# Patient Record
Sex: Female | Born: 1975 | Race: White | Hispanic: No | Marital: Married | Smoking: Former smoker
Health system: Southern US, Community
[De-identification: ages and names within clinical notes are randomized; demographics above are authoritative.]

## PROBLEM LIST (undated history)

## (undated) DIAGNOSIS — B999 Unspecified infectious disease: Secondary | ICD-10-CM

## (undated) DIAGNOSIS — O24419 Gestational diabetes mellitus in pregnancy, unspecified control: Secondary | ICD-10-CM

## (undated) DIAGNOSIS — Z91018 Allergy to other foods: Secondary | ICD-10-CM

## (undated) DIAGNOSIS — F329 Major depressive disorder, single episode, unspecified: Secondary | ICD-10-CM

## (undated) DIAGNOSIS — E669 Obesity, unspecified: Secondary | ICD-10-CM

## (undated) DIAGNOSIS — K8020 Calculus of gallbladder without cholecystitis without obstruction: Secondary | ICD-10-CM

## (undated) DIAGNOSIS — Z9889 Other specified postprocedural states: Secondary | ICD-10-CM

## (undated) DIAGNOSIS — I10 Essential (primary) hypertension: Secondary | ICD-10-CM

## (undated) DIAGNOSIS — R112 Nausea with vomiting, unspecified: Secondary | ICD-10-CM

## (undated) DIAGNOSIS — F419 Anxiety disorder, unspecified: Secondary | ICD-10-CM

## (undated) DIAGNOSIS — T4145XA Adverse effect of unspecified anesthetic, initial encounter: Secondary | ICD-10-CM

## (undated) DIAGNOSIS — IMO0002 Reserved for concepts with insufficient information to code with codable children: Secondary | ICD-10-CM

## (undated) HISTORY — DX: Gestational diabetes mellitus in pregnancy, unspecified control: O24.419

## (undated) HISTORY — DX: Essential (primary) hypertension: I10

## (undated) HISTORY — DX: Calculus of gallbladder without cholecystitis without obstruction: K80.20

## (undated) HISTORY — DX: Major depressive disorder, single episode, unspecified: F32.9

## (undated) HISTORY — DX: Anxiety disorder, unspecified: F41.9

## (undated) HISTORY — PX: WISDOM TOOTH EXTRACTION: SHX21

## (undated) HISTORY — DX: Reserved for concepts with insufficient information to code with codable children: IMO0002

## (undated) HISTORY — PX: NO PAST SURGERIES: SHX2092

## (undated) HISTORY — PX: SHOULDER SURGERY: SHX246

## (undated) HISTORY — DX: Unspecified infectious disease: B99.9

## (undated) HISTORY — DX: Adverse effect of unspecified anesthetic, initial encounter: T41.45XA

## (undated) HISTORY — DX: Obesity, unspecified: E66.9

---

## 1998-01-16 ENCOUNTER — Emergency Department (HOSPITAL_COMMUNITY): Admission: EM | Admit: 1998-01-16 | Discharge: 1998-01-17 | Payer: Self-pay | Admitting: Emergency Medicine

## 1998-02-23 ENCOUNTER — Ambulatory Visit (HOSPITAL_COMMUNITY): Admission: RE | Admit: 1998-02-23 | Discharge: 1998-02-23 | Payer: Self-pay | Admitting: *Deleted

## 1999-08-12 ENCOUNTER — Encounter: Payer: Self-pay | Admitting: Emergency Medicine

## 1999-08-12 ENCOUNTER — Emergency Department (HOSPITAL_COMMUNITY): Admission: EM | Admit: 1999-08-12 | Discharge: 1999-08-12 | Payer: Self-pay | Admitting: Emergency Medicine

## 2000-05-26 ENCOUNTER — Emergency Department (HOSPITAL_COMMUNITY): Admission: EM | Admit: 2000-05-26 | Discharge: 2000-05-26 | Payer: Self-pay | Admitting: Emergency Medicine

## 2001-05-13 DIAGNOSIS — R87619 Unspecified abnormal cytological findings in specimens from cervix uteri: Secondary | ICD-10-CM

## 2001-05-13 DIAGNOSIS — IMO0002 Reserved for concepts with insufficient information to code with codable children: Secondary | ICD-10-CM

## 2001-05-13 HISTORY — DX: Reserved for concepts with insufficient information to code with codable children: IMO0002

## 2001-05-13 HISTORY — DX: Unspecified abnormal cytological findings in specimens from cervix uteri: R87.619

## 2002-09-16 ENCOUNTER — Other Ambulatory Visit: Admission: RE | Admit: 2002-09-16 | Discharge: 2002-09-16 | Payer: Self-pay | Admitting: Obstetrics and Gynecology

## 2003-05-14 DIAGNOSIS — F32A Depression, unspecified: Secondary | ICD-10-CM

## 2003-05-14 DIAGNOSIS — K8020 Calculus of gallbladder without cholecystitis without obstruction: Secondary | ICD-10-CM

## 2003-05-14 HISTORY — DX: Depression, unspecified: F32.A

## 2003-05-14 HISTORY — DX: Calculus of gallbladder without cholecystitis without obstruction: K80.20

## 2003-07-26 ENCOUNTER — Other Ambulatory Visit: Admission: RE | Admit: 2003-07-26 | Discharge: 2003-07-26 | Payer: Self-pay | Admitting: Obstetrics and Gynecology

## 2003-09-28 ENCOUNTER — Ambulatory Visit (HOSPITAL_COMMUNITY): Admission: RE | Admit: 2003-09-28 | Discharge: 2003-09-28 | Payer: Self-pay | Admitting: Obstetrics and Gynecology

## 2003-11-04 ENCOUNTER — Ambulatory Visit (HOSPITAL_COMMUNITY): Admission: RE | Admit: 2003-11-04 | Discharge: 2003-11-04 | Payer: Self-pay | Admitting: Obstetrics and Gynecology

## 2003-12-27 ENCOUNTER — Encounter: Admission: RE | Admit: 2003-12-27 | Discharge: 2003-12-27 | Payer: Self-pay | Admitting: Gynecology

## 2004-01-05 ENCOUNTER — Ambulatory Visit (HOSPITAL_COMMUNITY): Admission: RE | Admit: 2004-01-05 | Discharge: 2004-01-05 | Payer: Self-pay | Admitting: Obstetrics and Gynecology

## 2004-01-20 ENCOUNTER — Inpatient Hospital Stay (HOSPITAL_COMMUNITY): Admission: AD | Admit: 2004-01-20 | Discharge: 2004-01-20 | Payer: Self-pay | Admitting: Obstetrics and Gynecology

## 2004-01-26 ENCOUNTER — Inpatient Hospital Stay (HOSPITAL_COMMUNITY): Admission: AD | Admit: 2004-01-26 | Discharge: 2004-01-28 | Payer: Self-pay | Admitting: Obstetrics and Gynecology

## 2004-02-02 ENCOUNTER — Ambulatory Visit (HOSPITAL_COMMUNITY): Admission: RE | Admit: 2004-02-02 | Discharge: 2004-02-02 | Payer: Self-pay | Admitting: Obstetrics & Gynecology

## 2004-02-07 ENCOUNTER — Ambulatory Visit: Payer: Self-pay | Admitting: Obstetrics & Gynecology

## 2004-02-10 ENCOUNTER — Ambulatory Visit: Payer: Self-pay | Admitting: Family Medicine

## 2004-02-11 DIAGNOSIS — T8859XA Other complications of anesthesia, initial encounter: Secondary | ICD-10-CM

## 2004-02-11 HISTORY — DX: Other complications of anesthesia, initial encounter: T88.59XA

## 2004-02-14 ENCOUNTER — Ambulatory Visit: Payer: Self-pay | Admitting: *Deleted

## 2004-02-16 ENCOUNTER — Ambulatory Visit (HOSPITAL_COMMUNITY): Admission: RE | Admit: 2004-02-16 | Discharge: 2004-02-16 | Payer: Self-pay | Admitting: Obstetrics and Gynecology

## 2004-02-16 ENCOUNTER — Ambulatory Visit: Payer: Self-pay | Admitting: Obstetrics & Gynecology

## 2004-02-20 ENCOUNTER — Ambulatory Visit: Payer: Self-pay | Admitting: Obstetrics and Gynecology

## 2004-02-23 ENCOUNTER — Inpatient Hospital Stay (HOSPITAL_COMMUNITY): Admission: RE | Admit: 2004-02-23 | Discharge: 2004-02-26 | Payer: Self-pay | Admitting: Obstetrics and Gynecology

## 2004-03-01 ENCOUNTER — Inpatient Hospital Stay (HOSPITAL_COMMUNITY): Admission: AD | Admit: 2004-03-01 | Discharge: 2004-03-01 | Payer: Self-pay | Admitting: Obstetrics and Gynecology

## 2004-05-13 HISTORY — PX: CHOLECYSTECTOMY: SHX55

## 2004-05-31 ENCOUNTER — Ambulatory Visit (HOSPITAL_COMMUNITY): Admission: RE | Admit: 2004-05-31 | Discharge: 2004-06-01 | Payer: Self-pay | Admitting: General Surgery

## 2004-05-31 ENCOUNTER — Encounter (INDEPENDENT_AMBULATORY_CARE_PROVIDER_SITE_OTHER): Payer: Self-pay | Admitting: Specialist

## 2005-06-07 IMAGING — US US OB FOLLOW-UP
1 series · 13 of 22 positions shown · non-contrast
Comparison: none

CLINICAL DATA: Evaluate growth.

[Series 1: unknown · 0.33mm/px · 13 of 22 slices shown]
[im 1/22]
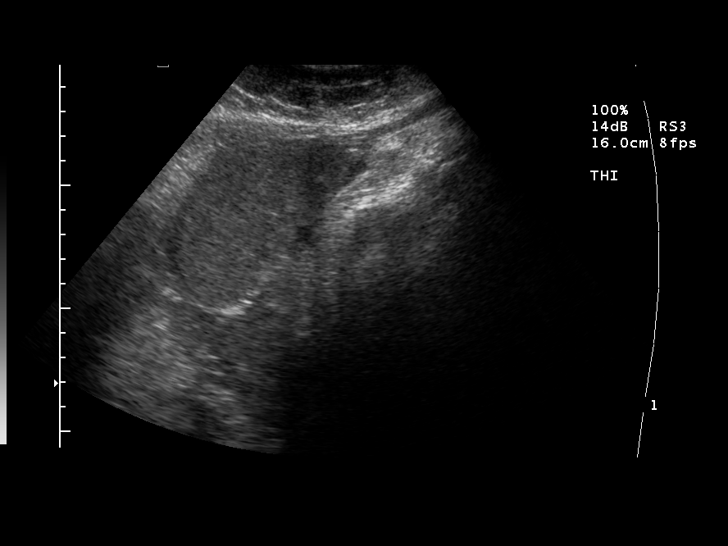
[im 3/22]
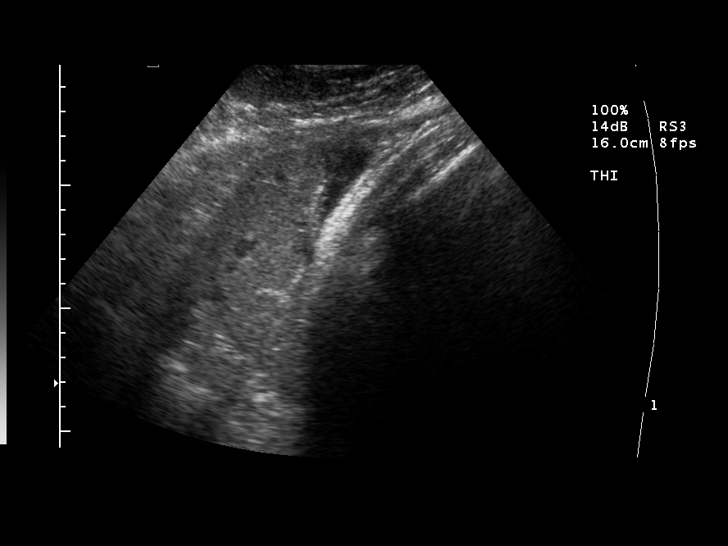
[im 5/22]
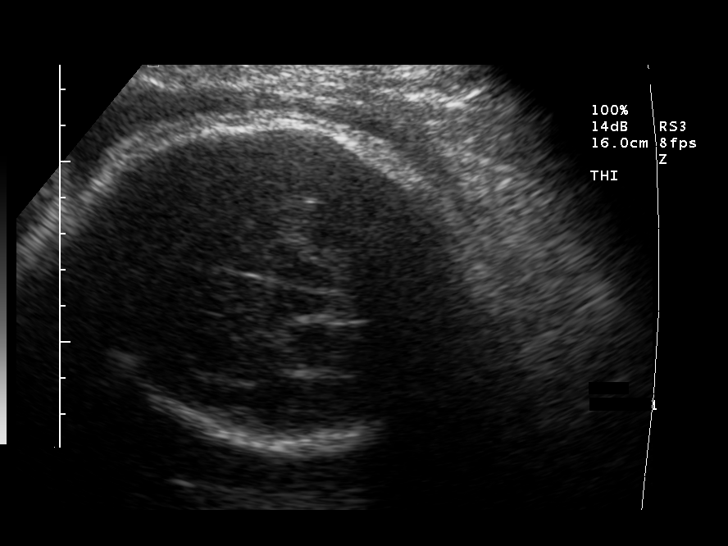
[im 6/22]
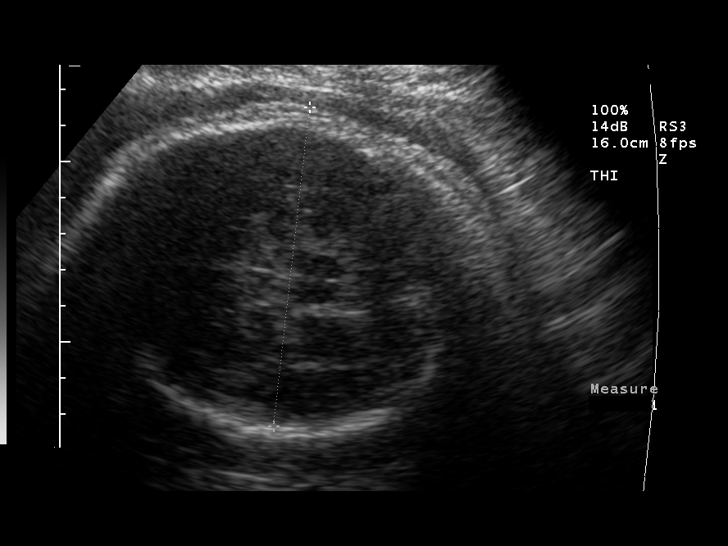
[im 8/22]
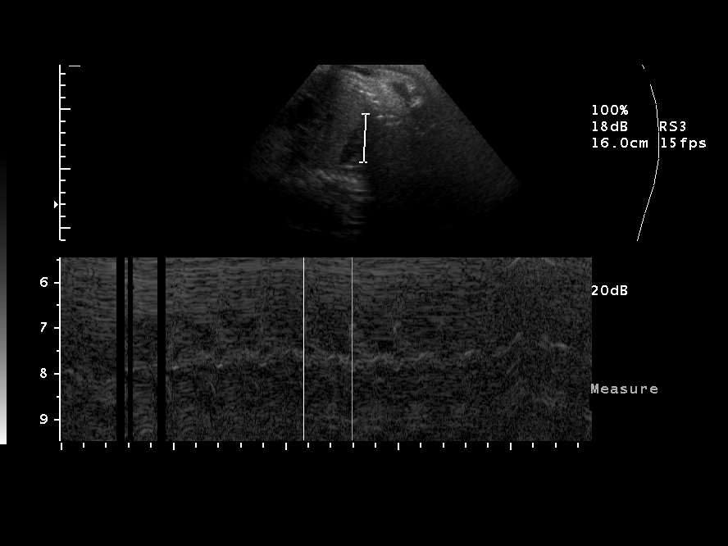
[im 10/22]
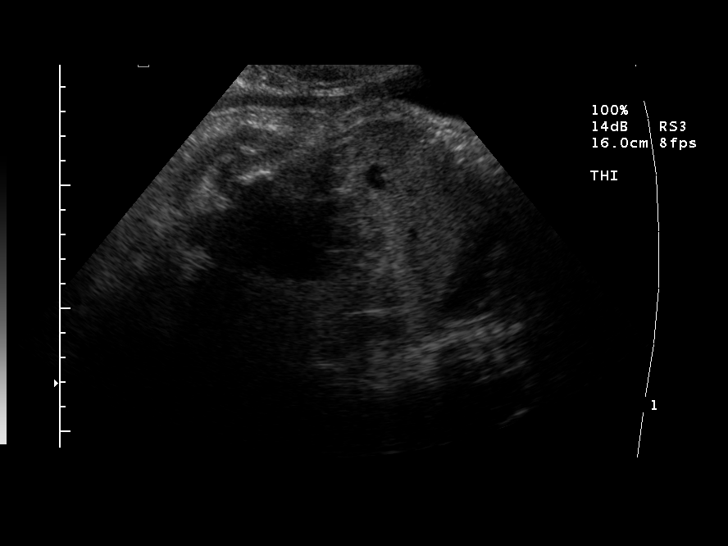
[im 12/22]
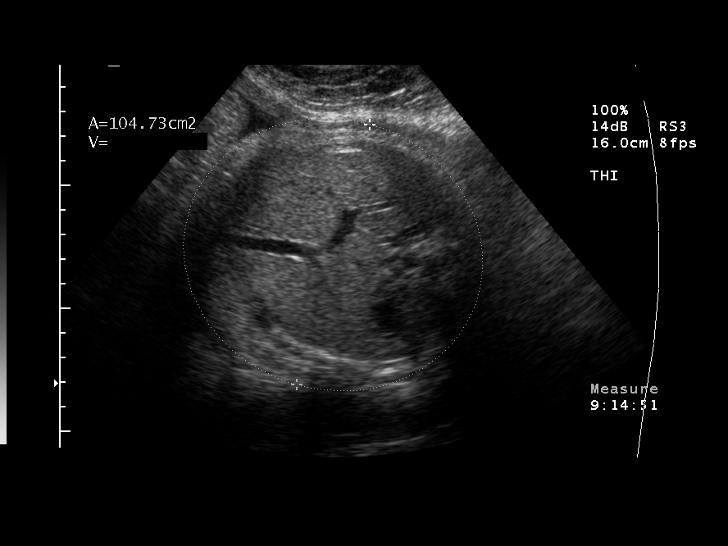
[im 13/22]
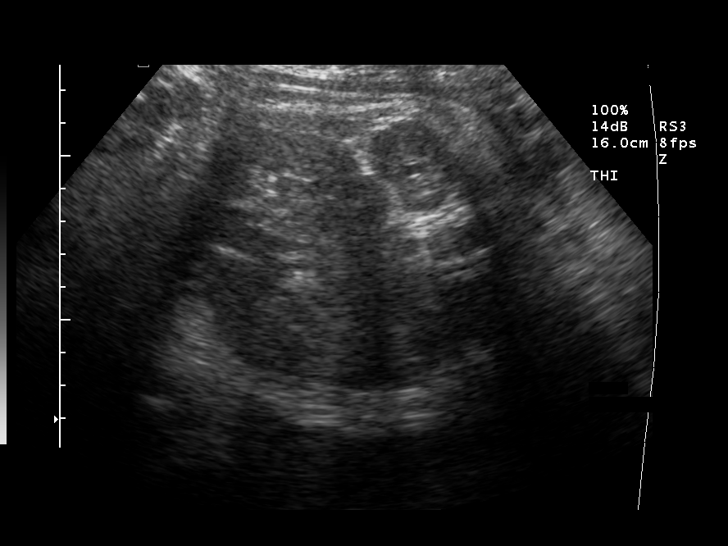
[im 15/22]
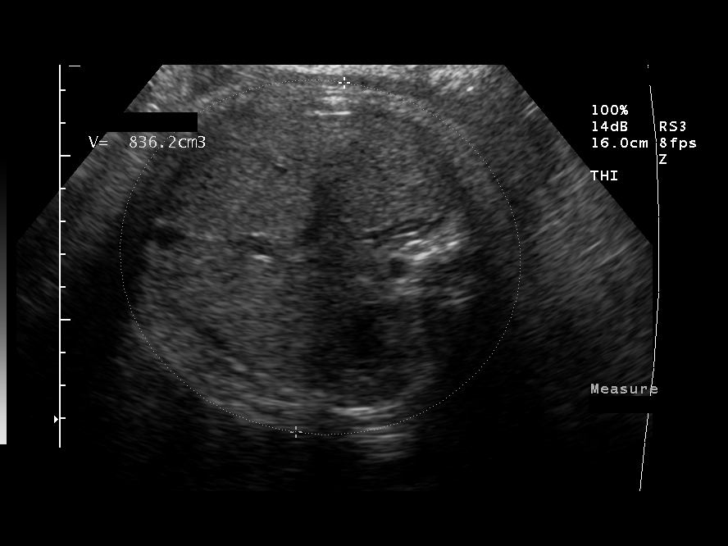
[im 17/22]
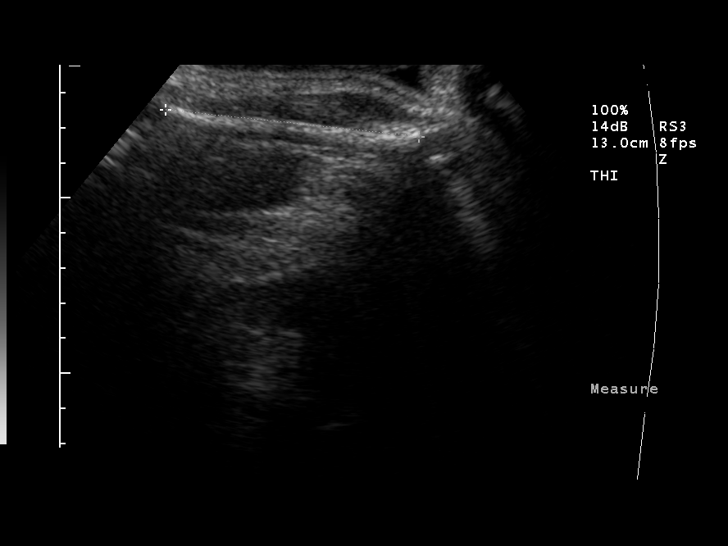
[im 18/22]
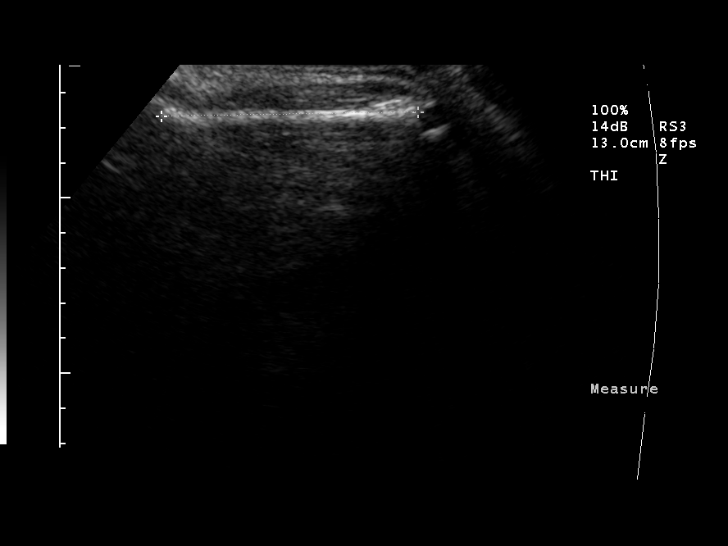
[im 20/22]
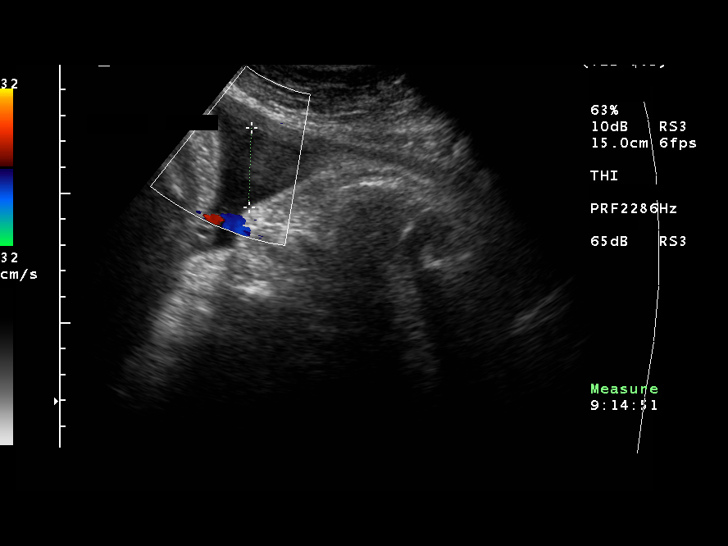
[im 22/22]
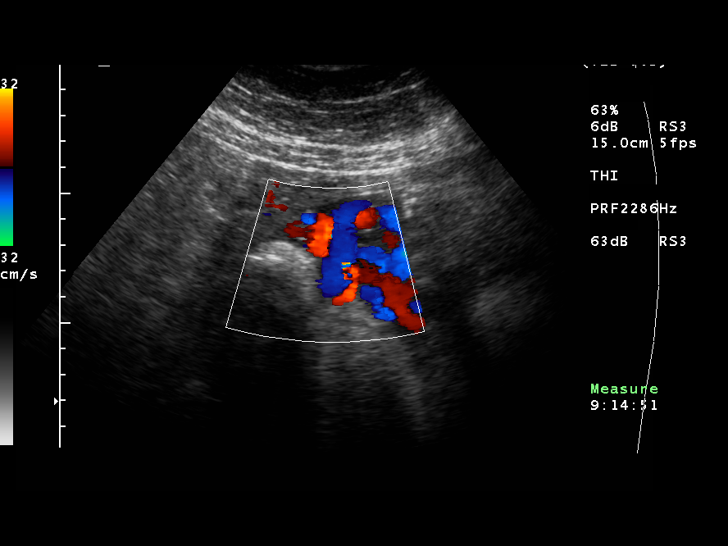

[13 of 22 positions shown; findings below may reference images not displayed]

OBSTETRICAL ULTRASOUND RE-EVALUATION
Number of Fetuses: 1
Heart Rate:  140
Movement:  Yes
Breathing:  No
Presentation:  Cephalic
Placental Location:  Fundal, posterior
Grade:  II
Previa:  No
Amniotic Fluid (subjective):  Low normal
Amniotic Fluid (objective):    7.6 cm AFI (5th -95th%ile = 7.3 ? 23.9 cm for 38 wks)

FETAL BIOMETRY
BPD:  8.8 cm   35 w 5 d
HC:  32.7 cm   37 w 1 d
AC:  36.2 cm   40 w 1 d
FL:  7.2 cm   37 w 0 d

Mean GA:  37 w 4 d
Assigned GA:  38 w 2 d
BPD/OFD: .80 (0.70 ? 0.86); FL/BPD: .82 (0.71  0.87); FL/AC: .20 (0.20 ? 0.24); HC/AC: .90 (.92 ? 1.05)
EFW:  0506 + / - 534 g (H) 75th ? 90th%ile (5942 ? 5859 g) For 38 wks

FETAL ANATOMY
Lateral Ventricles:  Visualized 
Thalami/CSP:  Previously seen 
Posterior Fossa:  Previously seen 
Nuchal Region:  N/A
Spine:  Previously seen 
4 Chamber Heart on Left:  Previously seen 
Stomach on Left:  Visualized 
3 Vessel Cord:  Previously seen 
Cord Insertion Site:  Previously seen 
Kidneys:  Visualized 
Bladder:  Visualized 
Extremities:  Previously seen 

ADDITIONAL ANATOMY VISUALIZED:  Diaphragm and female genitalia.

MATERNAL FINDINGS
Cervix:  Not evaluated.
IMPRESSION: Single intrauterine pregnancy demonstrating an estimated gestational age by ultrasound of 37 weeks and 4 days.  The abdominal circumference is larger than the remaining gestational indicators with an absolute measurement of 36.2 cm (40 w 1 d).  This results in an estimated fetal weight between the 75th and 90th percentile.  This represents an interval decrease in estimated fetal weight from the prior exam at which time it was just above the 95th percentile.
Subjectively and quantitatively lower normal amniotic fluid volume.  
No late developing fetal anatomic abnormalities are identified associated with the lateral ventricles, stomach, kidneys or bladder. A four chamber heart view could not be reassessed due to positioning on today?s exam.

## 2005-09-20 IMAGING — RF DG CHOLANGIOGRAM OPERATIVE
1 series · 7 of 7 positions shown · non-contrast
Comparison: none

CLINICAL DATA: Cholelithiasis

[Series 1: run · 2 acquisitions, 7 frames shown]
[im 1/2]
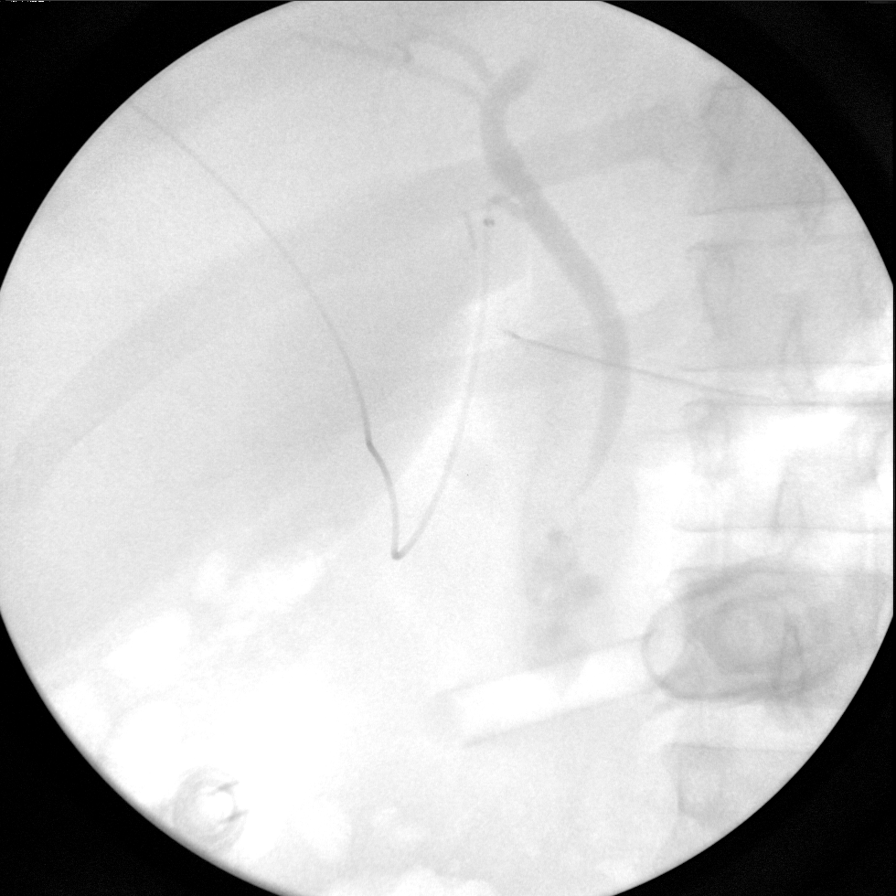
[im 1/2]
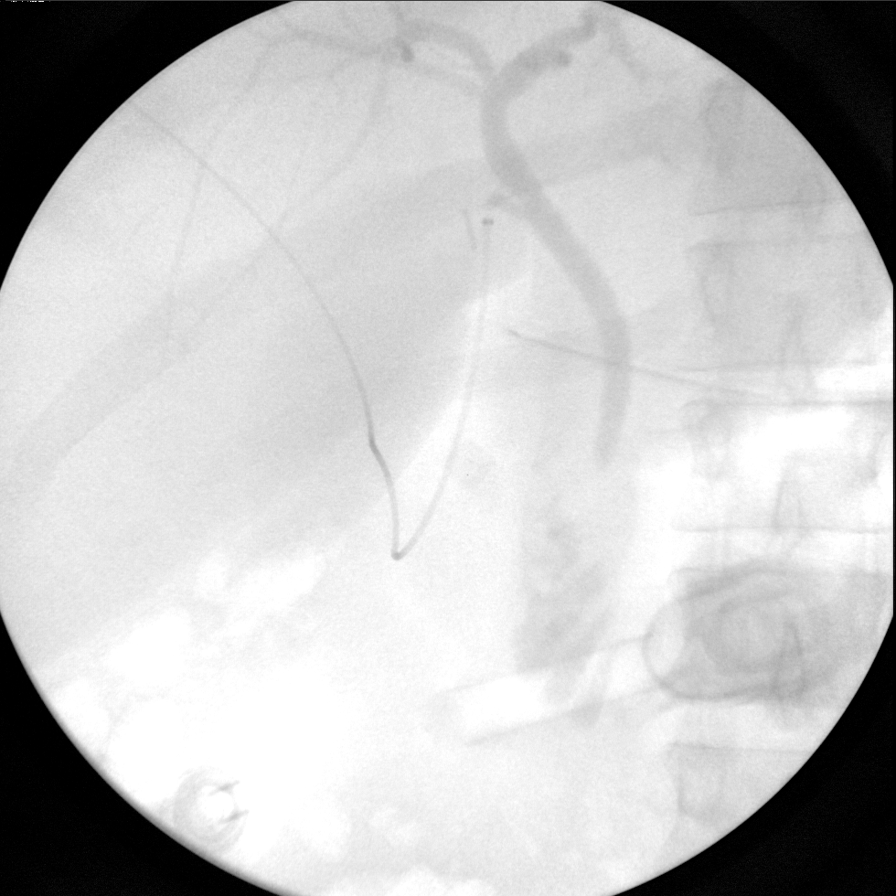
[im 1/2]
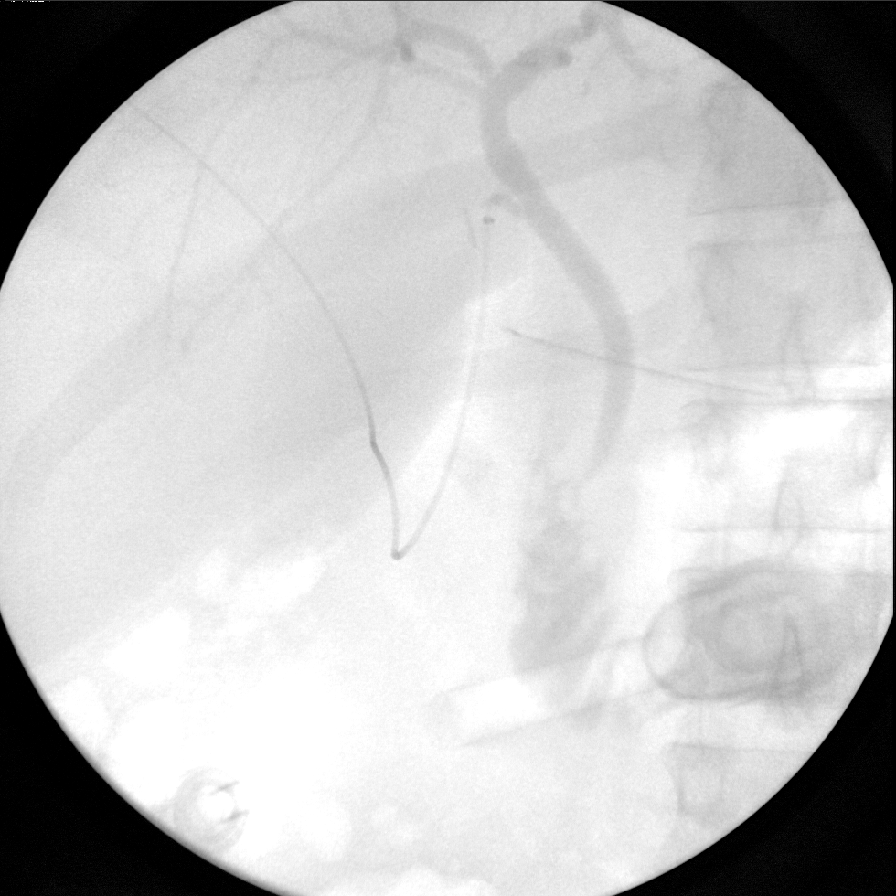
[im 1/2]
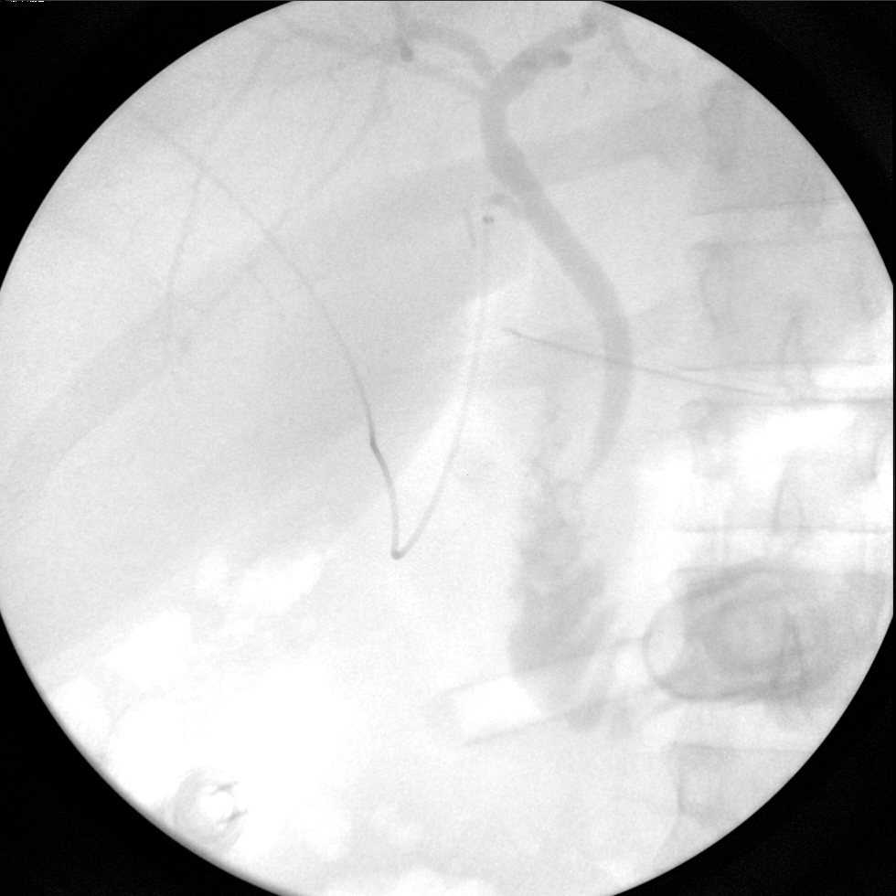
[im 2/2]
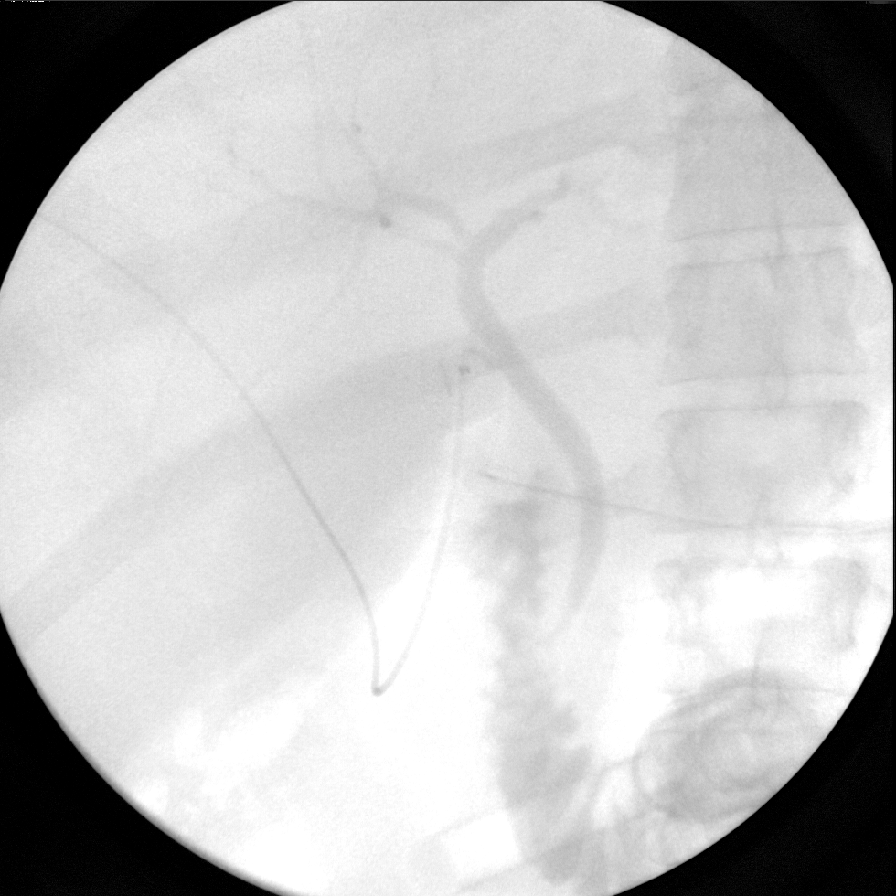
[im 2/2]
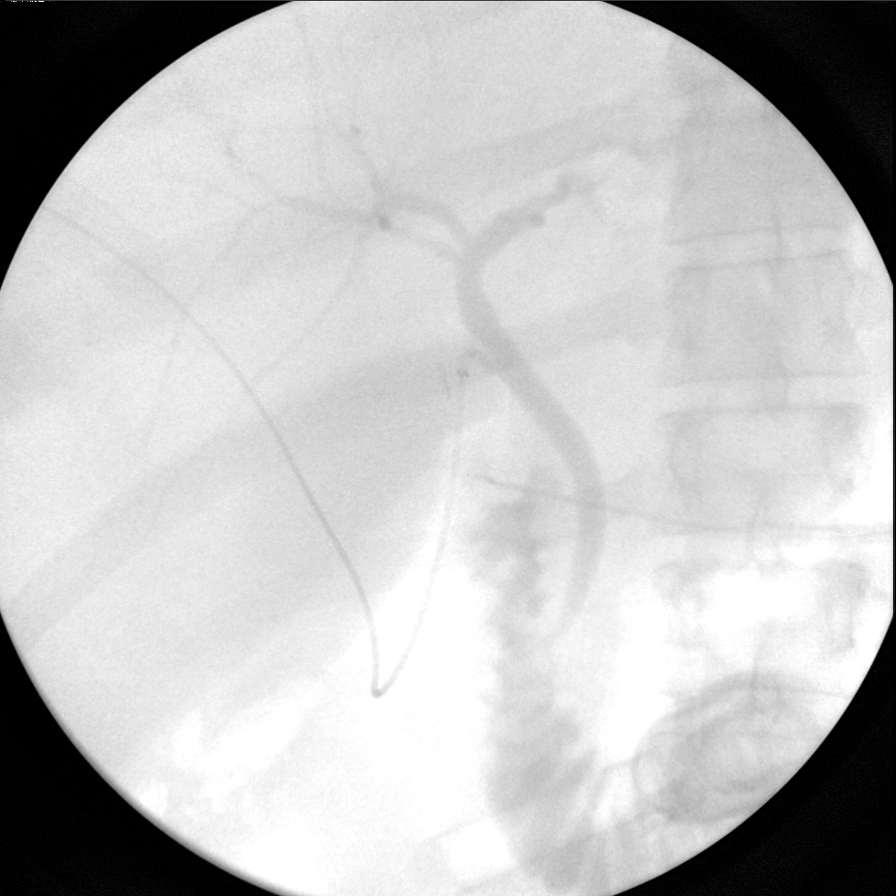
[im 2/2]
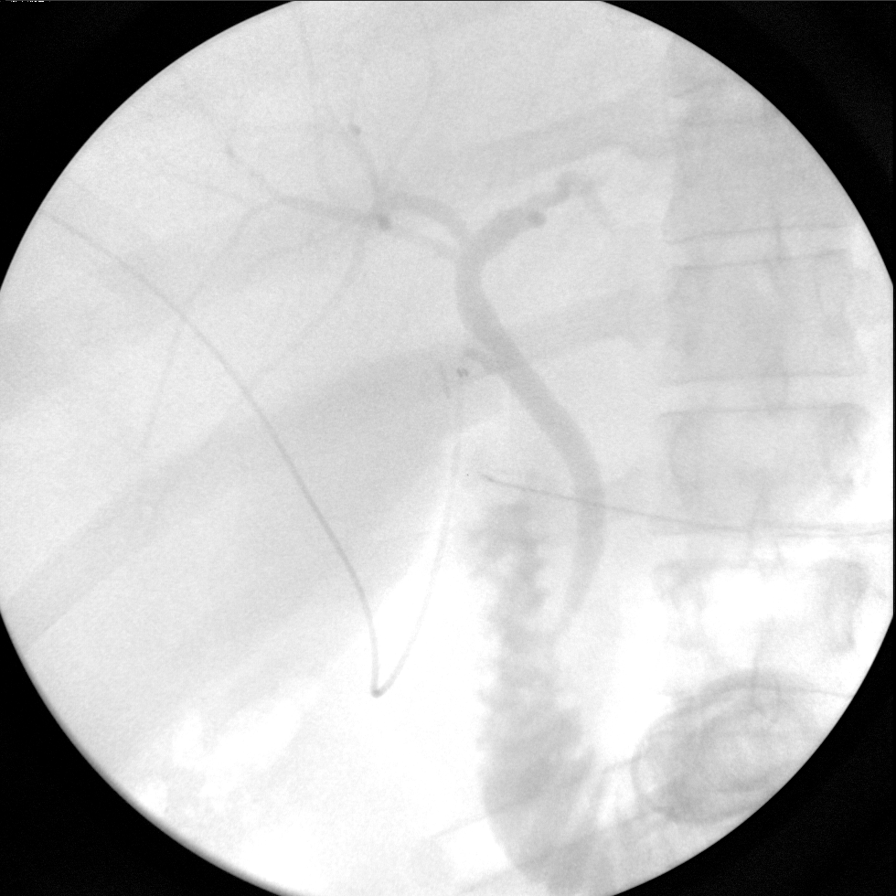

[7 of 7 positions shown; findings below may reference images not displayed]

INTRAOPERATIVE CHOLANGIOGRAM:

76 images  from intraoperative C-arm fluoroscopy demonstrates opacification of
the common bile duct. No filling defects to suggest retained stones. There is
incomplete evaluation of intrahepatic biliary tree, which appears decompressed
centrally. Contrast appears to flow on into decompressed duodenum.
IMPRESSION: 1. Negative for retained common duct stone

## 2009-06-16 ENCOUNTER — Ambulatory Visit (HOSPITAL_COMMUNITY): Admission: RE | Admit: 2009-06-16 | Discharge: 2009-06-16 | Payer: Self-pay | Admitting: Orthopedic Surgery

## 2010-06-02 ENCOUNTER — Encounter: Payer: Self-pay | Admitting: Obstetrics and Gynecology

## 2010-08-01 LAB — CBC
HCT: 39.1 % (ref 36.0–46.0)
MCV: 90.9 fL (ref 78.0–100.0)
Platelets: 284 10*3/uL (ref 150–400)
RDW: 13.6 % (ref 11.5–15.5)
WBC: 10.6 10*3/uL — ABNORMAL HIGH (ref 4.0–10.5)

## 2010-08-01 LAB — BASIC METABOLIC PANEL
BUN: 6 mg/dL (ref 6–23)
CO2: 29 mEq/L (ref 19–32)
Creatinine, Ser: 0.7 mg/dL (ref 0.4–1.2)
GFR calc Af Amer: >60 mL/min (ref >60)
Glucose, Bld: 131 mg/dL — ABNORMAL HIGH (ref 70–99)
Sodium: 139 mEq/L (ref 135–145)

## 2010-08-01 LAB — URINALYSIS, ROUTINE W REFLEX MICROSCOPIC
Glucose, UA: NEGATIVE mg/dL
Nitrite: NEGATIVE
Protein, ur: NEGATIVE mg/dL
Specific Gravity, Urine: 1.02 (ref 1.005–1.030)

## 2010-08-01 LAB — APTT: aPTT: 27 seconds (ref 24–37)

## 2010-08-01 LAB — HCG, SERUM, QUALITATIVE: Preg, Serum: NEGATIVE

## 2010-08-01 LAB — DIFFERENTIAL
Lymphs Abs: 1.9 10*3/uL (ref 0.7–4.0)
Monocytes Relative: 3 % (ref 3–12)
Neutrophils Relative %: 77 % (ref 43–77)

## 2010-08-29 ENCOUNTER — Other Ambulatory Visit (HOSPITAL_COMMUNITY): Payer: Self-pay | Admitting: Orthopedic Surgery

## 2010-08-29 ENCOUNTER — Encounter (HOSPITAL_COMMUNITY)
Admission: RE | Admit: 2010-08-29 | Discharge: 2010-08-29 | Disposition: A | Discharge status: Home / Self Care | Payer: 59 | Source: Ambulatory Visit | Attending: Orthopedic Surgery | Admitting: Orthopedic Surgery

## 2010-08-29 LAB — CBC
HCT: 37.6 % (ref 36.0–46.0)
Hemoglobin: 13 g/dL (ref 12.0–15.0)
MCH: 30.4 pg (ref 26.0–34.0)
MCV: 87.9 fL (ref 78.0–100.0)
Platelets: 297 10*3/uL (ref 150–400)
RBC: 4.28 MIL/uL (ref 3.87–5.11)

## 2010-08-29 LAB — URINALYSIS, ROUTINE W REFLEX MICROSCOPIC
Glucose, UA: NEGATIVE mg/dL
Ketones, ur: 15 mg/dL — AB
Specific Gravity, Urine: 1.03 (ref 1.005–1.030)
pH: 5.5 (ref 5.0–8.0)

## 2010-08-29 LAB — BASIC METABOLIC PANEL
BUN: 8 mg/dL (ref 6–23)
CO2: 29 mEq/L (ref 19–32)

## 2010-08-29 LAB — URINE MICROSCOPIC-ADD ON

## 2010-08-29 LAB — HCG, SERUM, QUALITATIVE: Preg, Serum: NEGATIVE

## 2010-08-29 LAB — DIFFERENTIAL
Basophils Absolute: 0.1 10*3/uL (ref 0.0–0.1)
Basophils Relative: 1 % (ref 0–1)
Lymphocytes Relative: 26 % (ref 12–46)
Monocytes Absolute: 0.5 10*3/uL (ref 0.1–1.0)
Neutrophils Relative %: 66 % (ref 43–77)

## 2010-08-29 LAB — TYPE AND SCREEN: Antibody Screen: NEGATIVE

## 2010-08-29 LAB — APTT: aPTT: 28 seconds (ref 24–37)

## 2010-08-29 LAB — SURGICAL PCR SCREEN: Staphylococcus aureus: NEGATIVE

## 2010-08-31 ENCOUNTER — Ambulatory Visit (HOSPITAL_COMMUNITY)
Admission: RE | Admit: 2010-08-31 | Discharge: 2010-08-31 | Disposition: A | Discharge status: Home / Self Care | Payer: 59 | Source: Ambulatory Visit | Attending: Orthopedic Surgery | Admitting: Orthopedic Surgery

## 2010-08-31 ENCOUNTER — Observation Stay (HOSPITAL_COMMUNITY)
Admission: RE | Admit: 2010-08-31 | Discharge: 2010-09-01 | Disposition: A | Discharge status: Home / Self Care | Payer: 59 | Source: Ambulatory Visit | Attending: Orthopedic Surgery | Admitting: Orthopedic Surgery

## 2010-08-31 ENCOUNTER — Other Ambulatory Visit (HOSPITAL_COMMUNITY): Payer: Self-pay | Admitting: Orthopedic Surgery

## 2010-08-31 DIAGNOSIS — Z5333 Arthroscopic surgical procedure converted to open procedure: Secondary | ICD-10-CM | POA: Insufficient documentation

## 2010-08-31 DIAGNOSIS — Z01818 Encounter for other preprocedural examination: Secondary | ICD-10-CM | POA: Insufficient documentation

## 2010-08-31 DIAGNOSIS — M7542 Impingement syndrome of left shoulder: Secondary | ICD-10-CM

## 2010-08-31 DIAGNOSIS — M719 Bursopathy, unspecified: Principal | ICD-10-CM | POA: Insufficient documentation

## 2010-08-31 DIAGNOSIS — G4730 Sleep apnea, unspecified: Secondary | ICD-10-CM | POA: Insufficient documentation

## 2010-08-31 DIAGNOSIS — M67919 Unspecified disorder of synovium and tendon, unspecified shoulder: Principal | ICD-10-CM | POA: Insufficient documentation

## 2010-08-31 DIAGNOSIS — I10 Essential (primary) hypertension: Secondary | ICD-10-CM | POA: Insufficient documentation

## 2010-09-01 LAB — CBC
HCT: 32.4 % — ABNORMAL LOW (ref 36.0–46.0)
MCH: 30.1 pg (ref 26.0–34.0)
MCHC: 34 g/dL (ref 30.0–36.0)
MCV: 88.8 fL (ref 78.0–100.0)
RDW: 13.5 % (ref 11.5–15.5)

## 2010-09-01 LAB — BASIC METABOLIC PANEL
BUN: 14 mg/dL (ref 6–23)
Calcium: 8.1 mg/dL — ABNORMAL LOW (ref 8.4–10.5)
Creatinine, Ser: 1.02 mg/dL (ref 0.4–1.2)
GFR calc non Af Amer: >60 mL/min (ref >60)
Glucose, Bld: 151 mg/dL — ABNORMAL HIGH (ref 70–99)
Sodium: 138 mEq/L (ref 135–145)

## 2010-09-05 NOTE — Op Note (Signed)
NAMESABREA, SANKEY            ACCOUNT NO.:  [id number]  MEDICAL RECORD NO.:  [id number]           PATIENT TYPE:  O  LOCATION:  5030                         FACILITY:  [facility]  PHYSICIAN:  Almedia Balls. Ranell Patrick, M.D. DATE OF BIRTH:  [birth year]  DATE OF PROCEDURE:  08/31/2010 DATE OF DISCHARGE:                              OPERATIVE REPORT   PREOPERATIVE DIAGNOSIS:  Left shoulder rotator cuff tear.  POSTOPERATIVE DIAGNOSIS:  Left shoulder rotator cuff tear.  PROCEDURE PERFORMED:  Left shoulder arthroscopy with arthroscopic subacromial decompression followed by mini open rotator cuff repair.  ATTENDING SURGEON:  Almedia Balls. Ranell Patrick, MD  ASSISTANT:  Donnie Coffin. Dixon, PA-C  General anesthesia was used plus interscalene block.  ESTIMATED BLOOD LOSS:  Minimal.  FLUID REPLACEMENT:  1200 mL crystalloid.  INSTRUMENT COUNT:  Correct.  COMPLICATIONS.:  None.  Preoperative antibiotics were given.  INDICATIONS:  The patient is a 35 year old female with worsening left shoulder pain secondary to MRI documented rotator cuff tear.  The patient has failed all measures of conservative management, presents now complaining of persistent shoulder pain, functional loss, desiring operative treatment.  Informed consent obtained.  DESCRIPTION OF PROCEDURE:  After an adequate level of anesthesia was achieved, the patient was positioned in a modified beach-chair position. Left shoulder was examined under anesthesia.  Full passive range of motion noted with no stiffness.  No instability.  We sterilely prepped and draped the shoulder and arm in usual manner.  We entered the shoulder using standard portals.  We entered posterior and lateral portals.  We identified synovitis within the shoulder, have chronic ruptured biceps tendon, supralabrum was atrophic and not really present. Anteroinferior, posteroinferior labrum had some small degenerative fraying but not really significant unstable tears.   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  We just performed a very gentle chondroplasty in tangential manner to remove some loose fibrillation, subscap appeared normal.  Axillary pouch free of loose bodies.  We placed the scope in subacromial and there was no sign of rotator cuff tear from the joint side.  In the bursal surface, we cleaned out the bursa using motorized shaver and did a minimal acromioplasty anteriorly and laterally using a high-speed bur creating a type 1 acromial shape and third decompression of rotator cuff outlet.  I did palpate and visualized rotator cuff.  There was a soft spot present just behind the bicipital groove.  We went ahead at this point and concluded the arthroscopic portion of surgery, made a mini open incision starting at the anterolateral border of the acromion staying distally about 4 cm.  Dissection down through subcutaneous tissues using Bovie, identified the raphe between the anterior and lateral heads of deltoid, divided that bluntly.  We went into the subdeltoid space identifying the rotator cuff tear, was easily palpable just posterior to the biceps groove, incised longitudinally, as I attempted immediately encountered very degenerative torn interstitial tearing.  We removed any unstable tendinous tissue.  We freshened up the rotator cuff footprint with a rongeur, placed a single 5.5 Bio-Corkscrew anchor by Arthrex and then also some side-to-sides of FiberWire, Vicryl for an anatomic  repair of the rotator cuff back into position restoring both medial and lateral portion of the footprint.  Following this we arranged the shoulder. There was no tension on the repair nor there was very impingement. Following thorough irrigation of subdeltoid interval, we repaired the deltoid to itself side-to-side with 0 Vicryl suture, followed by 2-0 Vicryl for subcutaneous closure and 4-0 Monocryl skin.  Steri-Strips applied,  followed by sterile dressing.  The patient tolerated the surgery well.     Almedia Balls. Ranell Patrick, M.D.     SRN/MEDQ  D:  08/31/2010  T:  09/01/2010  Job:  [id number]  Electronically Signed by Malon Kindle  on 09/05/2010 05:22:13 PM

## 2010-09-21 NOTE — Discharge Summary (Addendum)
  Monique Wallace, Monique Wallace            ACCOUNT NO.:  [id number]  MEDICAL RECORD NO.:  [id number]           PATIENT TYPE:  O  LOCATION:  XRAY                         FACILITY:  [facility]  PHYSICIAN:  Almedia Balls. Ranell Patrick, M.D. DATE OF BIRTH:  1976-01-16  DATE OF ADMISSION:  08/31/2010 DATE OF DISCHARGE:  09/01/2010                              DISCHARGE SUMMARY   ADMITTING DIAGNOSIS:  Left shoulder rotator cuff tear.  DISCHARGE DIAGNOSIS:  Left shoulder rotator cuff tear.  PROCEDURE PERFORMED:  Left shoulder rotator cuff repair on August 31, 2010.  CONSULTING SERVICES:  Occupational Therapy and Discharge Planning.  HISTORY OF PRESENT ILLNESS:  The patient is a 35 year old female with worsening shoulder pain secondary to a torn rotator cuff.  The patient presents now for hospitalization and operative treatment of her left shoulder.  For further details on the patient's past medical history and physical examination, please see the medical record.  HOSPITAL COURSE:  The patient admitted to the Orthopedic Service and taken to surgery on August 31, 2010, for left shoulder rotator cuff repair.  The patient tolerated the surgery well, was taken to postoperative floor where she had some physical therapy and occupational therapy for shoulder mobilization, tolerating regular diet and oral pain medications prior to discharge.  She was discharged in improved condition to follow up in Orthopedics in 10 days.  She will be instructed to ice her shoulder, wear her sling constantly when she is out of the house, and a pillow under her arm when she is at home.  She will be doing her exercises about 4-6 times a day to improve range of motion of blood flow to the arm.  Instructions to keep her shoulder clean and dry for 5 days as well.     Almedia Balls. Ranell Patrick, M.D.   ______________________________ Almedia Balls. Ranell Patrick, M.D.    SRN/MEDQ  D:  09/05/2010  T:  09/06/2010  Job:  [id number]  Electronically Signed  by Malon Kindle  on 09/21/2010 04:28:04 PM

## 2010-09-28 NOTE — Op Note (Signed)
Monique Wallace, Monique Wallace            ACCOUNT NO.:  [id number]   MEDICAL RECORD NO.:  [id number]          PATIENT TYPE:  INP   LOCATION:  9109                          [facility]   PHYSICIAN:  Janine Limbo, M.D.DATE OF BIRTH:  May 03, 1976   DATE OF PROCEDURE:  02/23/2004  DATE OF DISCHARGE:                                 OPERATIVE REPORT   PREOPERATIVE DIAGNOSES:  1.  [redacted] weeks gestation.  2.  Insulin requiring gestational diabetes.  3.  Obesity (weight 247 pounds, height 4 feet 11 inches).  4.  Macrosomia.   POSTOPERATIVE DIAGNOSES:  1.  [redacted] weeks gestation.  2.  Insulin requiring gestational diabetes.  3.  Obesity (weight 247 pounds, height 4 feet 11 inches).  4.  Fibroid uterus.   PROCEDURE:  Primary low transverse cesarean section.   SURGEON:  Janine Limbo, M.D.   FIRST ASSISTANT:  Renaldo Reel. Emilee Hero, C.N.M.   ANESTHESIA:  Spinal.   DISPOSITION:  [name] is a 35 year old female, gravida 2, para 0-0-1-0,  who presents at [redacted] weeks gestation. The patient has gestational diabetes  that has required insulin. Her blood sugars have only been in moderate  control. Induction of labor was discussed with the patient. The patient had  an ultrasound performed which showed the infant was in the 95% percentile  for weight. The patient considered her options and decided to proceed with  cesarean delivery. She had concerns about fetal trauma and maternal trauma.   FINDINGS:  A 7 pound 10 ounce female infant Child psychotherapist) was delivered from a  cephalic presentation. The Apgar's were 8 at 1 minute and 9 at 5 minutes.  The fallopian tubes and the ovaries were normal. There was a 1.5 cm fibroid  on the left posterior fundus of the uterus.   DESCRIPTION OF PROCEDURE:  The patient was taken to the operating room where  a spinal anesthetic was given. The patient's abdomen and perineum were  prepped with multiple layers of Betadine. A Foley catheter was placed in the  bladder. The  patient was sterilely draped. The lower abdomen was injected  with 10 mL of 0.5% Marcaine with epinephrine. A low transverse incision was  made in the abdomen and carried sharply through the subcutaneous tissue, the  fascia, and the anterior peritoneum. An incision was made in the lower  uterine segment and the bladder flap was developed. The incision was  extended in a low transverse fashion. The fetal head was delivered without  difficulty. The mouth and nose were suctioned.  The remainder of the infant  was delivered. The cord was clamped and cut and the infant was handed to the  waiting pediatric team. Routine cord blood studies were obtained. The  placenta was removed. The uterine cavity was cleaned of amniotic fluid,  clotted blood, and membranes. The uterine incision was closed using a  running locking suture of 2-0 Vicryl followed by an imbricating suture of 2-  0 Vicryl. Hemostasis was noted to be adequate. The pelvis was then  vigorously irrigated. The anterior peritoneum and the abdominal musculature  were reapproximated in the midline using  2-0 Vicryl. The fascia and the  subcutaneous layer were irrigated. The fascia was closed using a running  suture of #0 Vicryl followed by three interrupted sutures of #0 Vicryl. A  Jackson-Pratt drain was placed in the subcutaneous layer and brought out  through the left lower quadrant. The Jackson-Pratt drain was sutured into  place using 4-0 silk. The subcutaneous layer was closed using a running  suture of #0 Vicryl. The skin was reapproximated using a subcuticular suture  of 4-0 Vicryl. Sponge, needle and instrument counts were correct on two  occasions. The estimated blood loss for the procedure was 800 mL.  The  patient tolerated her procedure well. She was awakened from her anesthetic  and taken to the in stable condition. The infant was taken to the fullterm  nursery in stable condition.      AVS/MEDQ  D:  02/23/2004  T:   02/23/2004  Job:  [id number]   cc:   Laqueta Linden, M.D.  [address], [address]  [place]  [place] [address]  Fax: [phone]

## 2010-09-28 NOTE — Discharge Summary (Signed)
Monique Wallace, Monique Wallace                      ACCOUNT NO.:  [id number]   MEDICAL RECORD NO.:  [id number]                   PATIENT TYPE:  INP   LOCATION:  9172                                 [facility]   PHYSICIAN:  Hal Morales, M.D.             DATE OF BIRTH:  1976-04-17   DATE OF ADMISSION:  01/26/2004  DATE OF DISCHARGE:  01/28/2004                                 DISCHARGE SUMMARY   ADMISSION DIAGNOSES:  1.  Intrauterine pregnancy at 35 and three-sevenths weeks.  2.  Oligohydramnios.  3.  Gestational diabetes, poorly controlled.  4.  Macrosomic infant.   DISCHARGE DIAGNOSES:  1.  Intrauterine pregnancy at 35 and three-sevenths weeks.  2.  Oligohydramnios.  3.  Gestational diabetes, poorly controlled.  4.  Macrosomic infant.  5.  Improved diabetic control.  6.  Improved oligohydramnios.   HOSPITAL PROCEDURES:  1.  Electronic fetal monitoring.  2.  IV hydration.  3.  Ultrasound.  4.  Capillary blood glucose monitoring.   HOSPITAL COURSE:  The patient was admitted with low fluid noted on an office  ultrasound and variable decelerations on her NST, AFI was 4.4, and the  patient was admitted for observation.  Her blood sugars have been poorly  controlled in the past, mostly due to noncompliance with diet.  She was  admitted for monitoring and hydration and sliding scale insulin for glucose  coverage.  Glucophage was discontinued.  Later that evening, her 2-hour p.c.  blood sugar was noted to be 90 and her dinnertime one was noted to be 104.  IV hydration continued.  The next day, she continued to do well.  Two-hour  p.c. blood sugars were 90-104, fasting blood sugar was 93, fetal heart rate  was reactive with irregular mild contractions.  On January 28, 2004 an  ultrasound was done which revealed greater than 95th percentile growth, AFI  of 5.6 which was improved over 4.4, a grade 2 placenta, and a vertex  presentation with a cervical length of 3.0 cm.  Sterile  speculum exam was  done to rule out rupture of membranes and there was no evidence of ruptured  membranes found at that time.  Dr. Su Hilt saw her and observed her fetal  heart rate strip and her blood glucoses which had been under pretty good  control since admission, and decision was made that she could go home.  She  will start 4 units of NPH insulin each night and will return to the office  on Monday for an NST and another AFI per ultrasound.  Fetal kick counts were  discussed and she will continue bedrest at home with increased p.o. fluids.   DISCHARGE MEDICATIONS:  1.  Prenatal vitamins.  2.  NPH insulin.   DISCHARGE LABORATORY DATA:  White blood cell count 11.5, hemoglobin 12.1,  platelets 260.  Glucose 84, potassium 3.8, total bilirubin 0.4, creatinine  0.5.   DISCHARGE INSTRUCTIONS:  Include bedrest, fetal kick counts, pushing p.o.  fluids, and continued glucose monitoring with 4 units of NPH insulin every  night.  Discharge follow-up will occur on Monday with an NST and an AFI.      MLW/MEDQ  D:  01/28/2004  T:  01/30/2004  Job:  [id number]

## 2010-09-28 NOTE — Op Note (Signed)
Monique Wallace, Monique Wallace            ACCOUNT NO.:  [id number]   MEDICAL RECORD NO.:  [id number]          PATIENT TYPE:  OIB   LOCATION:  5727                         FACILITY:  [facility]   PHYSICIAN:  Cherylynn Ridges III, M.D.DATE OF BIRTH:  [birth year]   DATE OF PROCEDURE:  05/31/2004  DATE OF DISCHARGE:                                 OPERATIVE REPORT   PREOPERATIVE DIAGNOSIS:  Symptomatic cholelithiasis and acute cholecystitis.   POSTOPERATIVE DIAGNOSIS:  Symptomatic cholelithiasis and acute  cholecystitis.   PROCEDURE:  Laparoscopic cholecystectomy with cholangiogram.   SURGEON:  Cherylynn Ridges, M.D.   ASSISTANT:  Gabrielle Dare. Janee Morn, M.D.   ANESTHESIA:  General endotracheal.   ESTIMATED BLOOD LOSS:  Less than 30 mL.   COMPLICATIONS:  None.   CONDITION:  Stable.   FINDINGS:  Cholangiogram was negative for any intraductal stones,  dilatation, or obstruction.  The patient had a somewhat intrahepatic  gallbladder with a moderate amount of edema.   OPERATION:  The patient was taken to the operating room and placed on the  table in the supine position.  After an adequate endotracheal anesthetic was  administered she was prepped and draped in the usual sterile manner exposing  the midline and the right upper quadrant.   A supraumbilical curvilinear incision was made and taken down to the midline  fascia.  We grabbed the fascia on either side with Army-Navy retractors in  place and then made an incision in the fascia at the level of the umbilicus.  We took it down into the peritoneal cavity using blunt dissection between  Kelly clamps and then placed a pursestring suture of 0 Vicryl in there to  secure our Hasson cannula.   We subsequently passed the Hasson cannula into the peritoneal cavity with  minimal difficulty and then insufflated the abdomen up to a maximal intra-  abdominal pressure of 15 mmHg with carbon dioxide gas.  We then passed two  right costal margin 5-mm cannulas  and a subxiphoid 12-mm cannula under  direct vision.  With all cannulas in place and the abdomen insufflated we  placed the patient in reverse Trendelenburg and started our dissection.   The dome of the gallbladder did not reach the liver edge and therefore was  difficult to retract above the liver.  We did push it up towards the  anterior abdominal wall, past the infundibulum with a second grasper and  dissected off the peritoneum overlying the triangle of Calot and hepatic  duodenal triangle.  It was in that area that we identified the cystic duct.  A clip along the proximal or gallbladder side was placed and then a  cholecystodochotomy made through which a Cook catheter which had been passed  through the anterior abdominal wall was passed.  It was with the [facility]  catheter which had been adequately placed that we did a cholangiogram which  demonstrated good flow to the duodenum, no obstruction, no intraductal  stones, good proximal flow.  We subsequently removed the catheter and  clipped the cystic duct distally x3 and then transected the duct.  We then  dissected out  the cystic artery which was endoclipped proximally and  distally, proximally was endoclipped twice.  We then transected and  dissected off the gallbladder fundus bed with minimal difficulty.  Once this  was done we irrigated with saline, cauterized the bed for hemostasis.  Once  this was done we brought the gallbladder out the supraumbilical site using  an EndoCatch bag which was without difficulty.  Once this was done we closed  off the supraumbilical site with the pursestring suture which had been left  in place.  We inspected the gallbladder bed and irrigated in the right upper  quadrant with total liter of saline solution.  Once that was done all gas  was removed and all cannulas removed.   We injected all incision sites with 1/4% Marcaine with epinephrine, then we  closed them with a running subcuticular stitch of 4-0  Vicryl.  All counts  were correct.  The sterile dressings were applied.  The patient was taken to  the recovery room in stable condition.      Jame   JOW/MEDQ  D:  05/31/2004  T:  06/01/2004  Job:  [id number]
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
## 2010-09-28 NOTE — Discharge Summary (Signed)
NAMEFRANCI, Monique Wallace            ACCOUNT NO.:  [id number]   MEDICAL RECORD NO.:  [id number]          PATIENT TYPE:  INP   LOCATION:  9109                          [facility]ILITY:  [facility]   PHYSICIAN:  Osborn Coho, M.D.   DATE OF BIRTH:  1976-04-09   DATE OF ADMISSION:  02/23/2004  DATE OF DISCHARGE:  02/26/2004                                 DISCHARGE SUMMARY   ADMITTING DIAGNOSES:  1.  Forty-week gestation.  2.  Diabetes.  3.  Macrosomia.  4.  Obesity.  5.  Fibroids.   PROCEDURE:  Primary low transverse cesarean section.   DISCHARGE DIAGNOSES:  1.  Forty-week gestation.  2.  Diabetes.  3.  Macrosomia.  4.  Obesity.  5.  Fibroids.  6.  Primary low transverse cesarean section.   Monique Wallace is a 35 year old gravida 2 para 0-0-1-0 who presents at 39-and-a-  half weeks gestation who presented for elective primary low transverse  cesarean section secondary to diagnosis of macrosomia and gestational  diabetes.  She underwent primary low transverse cesarean section on February 23, 2004 by Dr. Marline Backbone with the birth of a 7-pound 10-ounce female  infant named Abigail with Apgar scores of 8 at one minute and 9 at five  minutes.  The patient has done well in the postoperative period.  Her  fasting blood sugar on postoperative day #1 was 95.  Her hemoglobin on  postoperative day #1 was 10.2.  Her vital signs remained stable, she is  afebrile.  Her infant was taken to the NICU and is stable and doing well.  On this her postoperative day #3 she is judged to be in satisfactory  condition for discharge.  She does complain of some burning with urination  and a clean catch urine sample will be obtained prior to discharge.  She is  to be discharged home with instructions per [facility] handout.   DISCHARGE MEDICATIONS:  1.  Motrin 600 mg p.o. q.6h. p.r.n. pain.  2.  Tylox one to two p.o. q.3-4h. p.r.n. pain.  3.  Prenatal vitamins.   Postoperative follow-up will be at [facility]  in 6 weeks.    SDM/MEDQ  D:  02/26/2004  T:  02/27/2004  Job:  [id number]

## 2010-09-28 NOTE — H&P (Signed)
NAMELENNETTE, FADER            ACCOUNT NO.:  [id number]   MEDICAL RECORD NO.:  [id number]          PATIENT TYPE:  WOC   LOCATION:  WOC                          FACILITY[facility]  [facility]   PHYSICIAN:  Janine Limbo, M.D.DATE OF BIRTH:  [birth year]   DATE OF ADMISSION:  DATE OF DISCHARGE:                                HISTORY & PHYSICAL   HISTORY OF PRESENT ILLNESS:  Ms. Baynes is a 35 year old female, gravida 2,  para 0-0-1-0, who presents at 39-1/2 weeks' gestation [facility] February 28, 2004)  for primary cesarean section.  The patient has been followed at the [facility] and Gynecology division of [facility] for this pregnancy that has complicated by insulin requiring  gestational diabetes.  The patient's blood sugars have recently been in good  control.  Initially her blood sugars were difficult to control.  The patient  had an ultrasound performed that showed her baby was greater than the 95th  percentile for size.  The patient has elected to proceed with cesarean  section because of her concern of a 4A traumatic delivery.   OBSTETRICAL HISTORY:  The patient had a miscarriage in 1996 between 51 and  20 weeks' gestation.   ALLERGIES:  Drug Allergies:  The patient reports that PENICILLIN causes  hives.   PAST MEDICAL HISTORY:  Dictation stopped at this point.      AVS/MEDQ  D:  02/22/2004  T:  02/22/2004  Job:  [id number]
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
## 2011-10-01 ENCOUNTER — Telehealth: Payer: Self-pay | Admitting: Obstetrics and Gynecology

## 2011-10-01 NOTE — Telephone Encounter (Signed)
Triage/epic 

## 2011-10-02 ENCOUNTER — Telehealth: Payer: Self-pay | Admitting: Obstetrics and Gynecology

## 2011-10-02 NOTE — Telephone Encounter (Signed)
Triage/epic 

## 2011-10-02 NOTE — Telephone Encounter (Signed)
PT CALLED REGARDING MSG, STATES N/V HAS BECOME BAD AND HAS BEEN UNABLE TO KEEP HER MEDS DOWN, PT HAS HX OF DIABETES TYPE II AND CHTN, IS CURRENTLY ON METFORMIN 1000MG  AND LABETOLOL 100MG  BID, STARTED MEDS ON TUES BUT HAS NOT KEPT THEM DOWN,?'S IF CAN HAVE SOME ZOFRAN CALLED IN.  PER AVS INFORMED PT B/C WE HAVE NOT SEEN HER FOR A WHILE WILL NEED TO SEE HER BEFORE RXING MEDS, PT OFFERED TO GO TO MAU OR MAKE APPT FOR TOMORROW, PT STATES WILL SEE IF SHE CAN KEEP A POTATO DOWN, IF SO WILL TRY TAKING MEDS AGAIN, IF DOES NOT WILL CALL AFTER HOURS OR TRY TO MAKE APPT FOR TOMORROW.

## 2011-10-10 ENCOUNTER — Other Ambulatory Visit: Payer: Self-pay

## 2011-10-10 ENCOUNTER — Other Ambulatory Visit: Payer: Self-pay | Admitting: Obstetrics and Gynecology

## 2011-10-10 ENCOUNTER — Ambulatory Visit (INDEPENDENT_AMBULATORY_CARE_PROVIDER_SITE_OTHER): Payer: 59 | Admitting: Obstetrics and Gynecology

## 2011-10-10 ENCOUNTER — Other Ambulatory Visit (INDEPENDENT_AMBULATORY_CARE_PROVIDER_SITE_OTHER): Payer: 59

## 2011-10-10 ENCOUNTER — Encounter: Payer: Self-pay | Admitting: Obstetrics and Gynecology

## 2011-10-10 VITALS — BP 110/60 | Resp 18 | Wt 253.0 lb

## 2011-10-10 VITALS — BP 110/64

## 2011-10-10 DIAGNOSIS — O26849 Uterine size-date discrepancy, unspecified trimester: Secondary | ICD-10-CM

## 2011-10-10 DIAGNOSIS — I10 Essential (primary) hypertension: Secondary | ICD-10-CM | POA: Insufficient documentation

## 2011-10-10 DIAGNOSIS — Z3201 Encounter for pregnancy test, result positive: Secondary | ICD-10-CM

## 2011-10-10 DIAGNOSIS — Z331 Pregnant state, incidental: Secondary | ICD-10-CM

## 2011-10-10 DIAGNOSIS — E119 Type 2 diabetes mellitus without complications: Secondary | ICD-10-CM | POA: Insufficient documentation

## 2011-10-10 DIAGNOSIS — O209 Hemorrhage in early pregnancy, unspecified: Secondary | ICD-10-CM

## 2011-10-10 DIAGNOSIS — N390 Urinary tract infection, site not specified: Secondary | ICD-10-CM

## 2011-10-10 DIAGNOSIS — R1110 Vomiting, unspecified: Secondary | ICD-10-CM

## 2011-10-10 DIAGNOSIS — O3680X0 Pregnancy with inconclusive fetal viability, not applicable or unspecified: Secondary | ICD-10-CM

## 2011-10-10 DIAGNOSIS — O2390 Unspecified genitourinary tract infection in pregnancy, unspecified trimester: Secondary | ICD-10-CM

## 2011-10-10 DIAGNOSIS — O200 Threatened abortion: Secondary | ICD-10-CM

## 2011-10-10 LAB — POCT URINALYSIS DIPSTICK
Ketones, UA: NEGATIVE
Nitrite, UA: NEGATIVE
Protein, UA: 1
Urobilinogen, UA: NEGATIVE

## 2011-10-10 LAB — US OB TRANSVAGINAL

## 2011-10-10 MED ORDER — ONDANSETRON HCL 4 MG PO TABS: 4.0000 mg | ORAL_TABLET | Freq: Three times a day (TID) | ORAL | Status: AC | PRN

## 2011-10-10 MED ORDER — NITROFURANTOIN MONOHYD MACRO 100 MG PO CAPS: 100.0000 mg | ORAL_CAPSULE | Freq: Two times a day (BID) | ORAL | Status: AC

## 2011-10-10 NOTE — Progress Notes (Signed)
PT HERE TODAY FOR NOB INTERVIEW.  PT HAS DIABETES TYPE II AND HYPERTENSION.  HAD MEDS CHANGED BY PCP D/T PREGNANCY.  CURRENTLY TAKING METFORMIN 1000MG  AND LABETOLOL 100 MG BID, LATEST CBG'S AND BP TODAY REPORTED TO AR.  PT ADVISED TO CHECK CBG'S QID (FASTING AND AFTER EACH MEAL) AND RTO IN 2 WKS FOR NOB WORK UP AND EVALUATE READINGS TO DETERMINE IF MEDS NEED READJUSTING.  PT ALSO STATES WHEN LEAVING URINE SAMPLE TODAY, NOTICED A LIGHT BROWN D/C.  PT UNSURE OF LMP AS WELL.  PER AR WORK IN FOR VIABILITY U/S AND F/U.  PT TO RTO TODAY @ 1330 FOR U/S AND F/U W/ EP @ 1400.

## 2011-10-10 NOTE — Progress Notes (Signed)
35 YO reports brown spotting while collecting her urine test during today's new OB interview. Admits to mild cramping on occasion for many weeks along with nausea, vomiting and loose stools attributed to Metformin.  Denies urinary frequency, urgency,  hematuria, fever or flank pain but does have post void dribbling.   Patient has also been having nausea and vomiting x 2 weeks with little ability to keep down solids (lost from 267lbs to  253lbs in two weeks).  Able to tolerate most liquids.  PMH: NIDDM, depression, hypertension  Meds: Cymbalta, Labetolol and Metformin  O: U/A pH 5.0;  SG 1.025;   bilirubin/protein  +1;   rbc 2+;    nitrite/glucose/ketones-negative; leukocytes-moderate   U/S 7 w 3 d SIUP with FHR 164 bpm  Abdomen: soft, obese, non-tender Pelvic: EGBUS-redundant mons otherwise-wnl; vagina-normal with brown discharge; cervix-distal, long and closed without lesions; uterus/adnexae-limited exam due to habitus  A: First Trimester Bleeding     Viable 7 w 3 d SIUP  (FHR 164)     UTI     Nasuea/Vomiting  P:  urine for culture-pending       Macrobid 100 mg  #14  1 po bid x 7 days no refills      CBC, CMET, LDH, uric acid, 24 hour urine for protein &     creatinine (per Dr. Su Hilt);  Pregnancy Panel, GC/CT-     pending      Zofran 4 mg # 30 mg 1 po  q  8 hours prn-nausea      RTO as scheduled in 2 weeks for NOB exam   Ademola Vert, PA-C

## 2011-10-10 NOTE — Progress Notes (Signed)
Viability u/s [redacted]w[redacted]d FHT 164

## 2011-10-11 LAB — COMPREHENSIVE METABOLIC PANEL
AST: 35 U/L (ref 0–37)
Albumin: 4 g/dL (ref 3.5–5.2)
Calcium: 9.6 mg/dL (ref 8.4–10.5)
Creat: 0.82 mg/dL (ref 0.50–1.10)
Sodium: 140 mEq/L (ref 135–145)

## 2011-10-11 LAB — PRENATAL PANEL VII
Antibody Screen: NEGATIVE
Basophils Absolute: 0 10*3/uL (ref 0.0–0.1)
Eosinophils Relative: 1 % (ref 0–5)
Lymphocytes Relative: 18 % (ref 12–46)
MCV: 89.3 fL (ref 78.0–100.0)
Neutro Abs: 8.4 10*3/uL — ABNORMAL HIGH (ref 1.7–7.7)
Platelets: 342 10*3/uL (ref 150–400)
RDW: 14.1 % (ref 11.5–15.5)
WBC: 11 10*3/uL — ABNORMAL HIGH (ref 4.0–10.5)

## 2011-10-11 LAB — GC/CHLAMYDIA PROBE AMP, GENITAL
Chlamydia, DNA Probe: NEGATIVE
GC Probe Amp, Genital: NEGATIVE

## 2011-10-11 LAB — LACTATE DEHYDROGENASE: LDH: 150 U/L (ref 94–250)

## 2011-10-12 LAB — CULTURE, OB URINE: Colony Count: 9000

## 2011-10-15 ENCOUNTER — Telehealth: Payer: Self-pay | Admitting: Obstetrics and Gynecology

## 2011-10-15 NOTE — Telephone Encounter (Signed)
Triage/epic 

## 2011-10-16 ENCOUNTER — Telehealth: Payer: Self-pay | Admitting: Obstetrics and Gynecology

## 2011-10-16 ENCOUNTER — Other Ambulatory Visit: Payer: Self-pay | Admitting: Obstetrics and Gynecology

## 2011-10-16 MED ORDER — ONDANSETRON 4 MG PO TBDP: 4.0000 mg | ORAL_TABLET | Freq: Three times a day (TID) | ORAL | Status: AC | PRN

## 2011-10-16 NOTE — Telephone Encounter (Signed)
TC to pt.  Informed Zofran ODT has been ordered.  EP will consult about letter.  Pt has called PCP about Metformin.  Pt then states does have hx anxiety .  Was on Klonopin  Which PCP D/C'd.  States may be contributing to nausea.  Will discuss with PCP if different med is recommended.

## 2011-10-16 NOTE — Telephone Encounter (Signed)
TC to pt.  States normally works from home but goes to office on Tuesdays.  Requesting note stating she can work from home that day due to nausea and vomiting.  Is afraid to drive. Zofran works when she can retain it but sometimes vomits pill. States may tolerate ODT better. Also is only taking BID.  Advised to take q 8 hours.  Also states  Received new RX for Metformin from PCP Dr Primus Bravo.  Was taking 1000mg  qd but new instructions are BID.  Pt will consult with PCP about dosage.  Staets blood  Sugars are 107-120.  Sometimes feels shakey but is monitoring sugars and diet.  Will consutl with EP regarding letter and change in Zofran RX.

## 2011-10-29 ENCOUNTER — Encounter: Payer: 59 | Admitting: Obstetrics and Gynecology

## 2011-11-28 ENCOUNTER — Ambulatory Visit (INDEPENDENT_AMBULATORY_CARE_PROVIDER_SITE_OTHER): Payer: 59 | Admitting: Obstetrics and Gynecology

## 2011-11-28 ENCOUNTER — Encounter: Payer: Self-pay | Admitting: Obstetrics and Gynecology

## 2011-11-28 VITALS — BP 110/62 | Ht 59.0 in | Wt 245.0 lb

## 2011-11-28 DIAGNOSIS — T360X5A Adverse effect of penicillins, initial encounter: Secondary | ICD-10-CM

## 2011-11-28 DIAGNOSIS — I10 Essential (primary) hypertension: Secondary | ICD-10-CM

## 2011-11-28 DIAGNOSIS — Z87440 Personal history of urinary (tract) infections: Secondary | ICD-10-CM

## 2011-11-28 DIAGNOSIS — Z880 Allergy status to penicillin: Secondary | ICD-10-CM

## 2011-11-28 DIAGNOSIS — Z888 Allergy status to other drugs, medicaments and biological substances status: Secondary | ICD-10-CM

## 2011-11-28 DIAGNOSIS — Z6841 Body Mass Index (BMI) 40.0 and over, adult: Secondary | ICD-10-CM | POA: Insufficient documentation

## 2011-11-28 DIAGNOSIS — E119 Type 2 diabetes mellitus without complications: Secondary | ICD-10-CM

## 2011-11-28 DIAGNOSIS — Z98891 History of uterine scar from previous surgery: Secondary | ICD-10-CM | POA: Insufficient documentation

## 2011-11-28 DIAGNOSIS — Z889 Allergy status to unspecified drugs, medicaments and biological substances status: Secondary | ICD-10-CM | POA: Insufficient documentation

## 2011-11-28 DIAGNOSIS — Z331 Pregnant state, incidental: Secondary | ICD-10-CM

## 2011-11-28 DIAGNOSIS — Z124 Encounter for screening for malignant neoplasm of cervix: Secondary | ICD-10-CM

## 2011-11-28 LAB — POCT URINALYSIS DIPSTICK
Bilirubin, UA: NEGATIVE
Glucose, UA: NEGATIVE
Ketones, UA: NEGATIVE
Nitrite, UA: NEGATIVE

## 2011-11-28 LAB — POCT WET PREP (WET MOUNT)
Bacteria Wet Prep HPF POC: NEGATIVE
Clue Cells Wet Prep Whiff POC: NEGATIVE
WBC, Wet Prep HPF POC: NEGATIVE
pH: 4

## 2011-11-28 NOTE — Progress Notes (Signed)
Pt declines genetic screenings  Pt does not have CBG's today informed pt to bring every visit

## 2011-11-28 NOTE — Progress Notes (Signed)
Subjective:    Monique Wallace is being seen today for her first obstetrical visit at [redacted]w[redacted]d gestation by LMP and 1st trimester [place]. On Metformin 1000 u daily and Labetalol 100 mg po BID.  Did not bring list of sugars--will bring next visit. Reports most FBS< 100, 2 hour pp < or = 115. Denies bleeding.  She reports Primus Bravo is her primary provider and manages her diabetes and HTN.  She does not remember what her last Hgb A1C was, or when it was done.    Her obstetrical history is significant for: Patient Active Problem List  Diagnosis  . Hypertension  . Diabetes mellitus  . Previous cesarean section  . Allergy to penicillin  . Allergy to phenergan   Hx previous C/S--likely will plan repeat C/S.Marland Kitchen  Relationship with FOB:  Partner with patient today  Patient does intend to breast feed.   Pregnancy history fully reviewed.  The following portions of the patient's history were reviewed and updated as appropriate: allergies, current medications, past family history, past medical history, past social history, past surgical history and problem list.   Review of Systems Pertinent ROS is described in HPI   Objective:   BP 110/62  Ht 4\' 11"  (1.499 m)  Wt 245 lb (111.131 kg)  BMI 49.48 kg/m2  LMP 08/19/2011 Wt Readings from Last 1 Encounters:  11/28/11 245 lb (111.131 kg)   BMI: Body mass index is 49.48 kg/(m^2).  General: alert, cooperative and no distress Respiratory: clear to auscultation bilaterally Cardiovascular: regular rate and rhythm, S1, S2 normal, no murmur Breasts:  No dominant masses, nipples erect Gastrointestinal: soft, non-tender; no masses,  no organomegaly Extremities: extremities normal, no pain or edema Vaginal Bleeding: None  EXTERNAL GENITALIA: normal appearing vulva with no masses, tenderness or lesions VAGINA: no abnormal discharge or lesions CERVIX: no lesions or cervical motion tenderness; cervix closed, long, firm UTERUS: gravid and  consistent with 14 weeks ADNEXA: no masses palpable and nontender FHR 160 OB EXAM PELVIMETRY: appears adequate   FHR:  160  bpm  Assessment:    Pregnancy at 14 3/7 weeks Type 2 diabetes, on Metformin Hypertension--on Labetalol 100 mg PO BID.  Plan:     Prenatal panel reviewed and discussed with the patient:yes Pap smear collected:yes GC/Chlamydia collected:no Wet prep:  Yes, WNL Discussion of Genetic testing options: Declines Prenatal vitamins recommended Problem list reviewed and updated.  Plan of care: Continue QID CBGs--bring information to [place] Continue Metformin and Labetalol at present dosages for now. Requests referral to Nutritionist to guide better food choices--will do. Follow up in 4 weeks for ROB. [place] for anatomy at [facility] prior to NV, due to body habitus (per [facility] [place] tech recommendation). Will need 24 hour urine for baseline. Needs fetal echo at 20 weeks.  Nigel Bridgeman CNM, [place] 11/28/2011 10:44 PM

## 2011-12-03 ENCOUNTER — Encounter: Payer: 59 | Attending: Obstetrics and Gynecology | Admitting: *Deleted

## 2011-12-03 ENCOUNTER — Encounter: Payer: Self-pay | Admitting: *Deleted

## 2011-12-03 DIAGNOSIS — E119 Type 2 diabetes mellitus without complications: Secondary | ICD-10-CM

## 2011-12-03 DIAGNOSIS — Z713 Dietary counseling and surveillance: Secondary | ICD-10-CM | POA: Insufficient documentation

## 2011-12-03 NOTE — Patient Instructions (Signed)
Goals:  Check glucose levels per MD as instructed  Follow Gestational Diabetes Diet as instructed  Call for follow-up as needed    

## 2011-12-03 NOTE — Progress Notes (Signed)
  Medical Nutrition Therapy:  Appt start time: 1400 end time:  1500.   Assessment:  Primary concerns today: type II diabetes while pregnant.   MEDICATIONS: see list   DIETARY INTAKE:  Usual eating pattern includes 2-3 meals and 1 snacks per day.  Everyday foods include starches, proteins, fruits, vegetables.  Avoided foods include none.    24-hr recall:  B ( AM): english muffin with cream cheese and tomato; grapes or other fruit; may have cereal  Snk ( AM): none  L ( PM): sometimes skips: may have sandwich with chips and cucumbers; sometimes grazes; sometimes eats out at [employer] ( PM): none D ( PM): spaghetti with hamburger and zucchini shredded in; bbq chicken and squash and corn Snk ( PM): fruit- may have peach cobler with ice cream Beverages: water, sometimes 1/2 tea  Usual physical activity: walks 1 day a week  Estimated energy needs: during pregnancy 2000 calories 225 g carbohydrates 150 g protein 56 g fat  Progress Towards Goal(s):  In progress.   Nutritional Diagnosis:  NB-1.1 Food and nutrition-related knowledge deficit As related to proper balance of fat, carbs, proteins per diabetic meal plan.  As evidenced by self-reported knowledge deficit and obesity.    Intervention:  Nutrition counseling provided.  Discussed importance of good glucose control during pregnancy and risks of poor control.  Discussed ranges for blood glucose: 60-90 mg/dl fasting and <147 mg/dl 2 hr pp.  Discussed carb counting and how to distribute carbs throughout day: 25-30g/breakfast; 30g/snack; 45g/lunch; 30g/snack; 45g/dinner; 30g/snack.  Always have protein with every eating occasion and limit dietary fats.  Also encouraged daily walking indoors.  Instructed her to avoid concentrated sweets such as dessert and sugary beverages.  Also discussed treatment for hypoglycemia.  Encouraged breastfeeding post partum and weight loss    Handouts given during visit include:  Diabetes during  pregnancy handout  Monitoring/Evaluation:  Dietary intake, exercise, BGM, and body weight prn.

## 2011-12-06 ENCOUNTER — Telehealth: Payer: Self-pay | Admitting: Obstetrics and Gynecology

## 2011-12-06 NOTE — Telephone Encounter (Signed)
TRIAGE/EPIC °

## 2011-12-10 ENCOUNTER — Other Ambulatory Visit: Payer: Self-pay | Admitting: Obstetrics and Gynecology

## 2011-12-10 DIAGNOSIS — Z3689 Encounter for other specified antenatal screening: Secondary | ICD-10-CM

## 2011-12-10 NOTE — Telephone Encounter (Signed)
Tc to pt regarding msg.  Informed pt per VL, wants anatomy scan done @ [facility] before her ROB appt on 12/26/11 @ 1630.  Order made @ [facility], spoke w/ Victorino Dike, appt for anatomy sched @ 1400 before ROB, pt called to make aware, pt voices agreement.

## 2011-12-26 ENCOUNTER — Ambulatory Visit (INDEPENDENT_AMBULATORY_CARE_PROVIDER_SITE_OTHER): Payer: 59 | Admitting: Obstetrics and Gynecology

## 2011-12-26 ENCOUNTER — Encounter: Payer: Self-pay | Admitting: Obstetrics and Gynecology

## 2011-12-26 ENCOUNTER — Ambulatory Visit (HOSPITAL_COMMUNITY)
Admission: RE | Admit: 2011-12-26 | Discharge: 2011-12-26 | Disposition: A | Discharge status: Home / Self Care | Payer: 59 | Source: Ambulatory Visit | Attending: Obstetrics and Gynecology | Admitting: Obstetrics and Gynecology

## 2011-12-26 ENCOUNTER — Other Ambulatory Visit: Payer: 59

## 2011-12-26 VITALS — BP 118/60 | Wt 242.0 lb

## 2011-12-26 DIAGNOSIS — Z363 Encounter for antenatal screening for malformations: Secondary | ICD-10-CM | POA: Insufficient documentation

## 2011-12-26 DIAGNOSIS — E669 Obesity, unspecified: Secondary | ICD-10-CM | POA: Insufficient documentation

## 2011-12-26 DIAGNOSIS — O358XX0 Maternal care for other (suspected) fetal abnormality and damage, not applicable or unspecified: Secondary | ICD-10-CM | POA: Insufficient documentation

## 2011-12-26 DIAGNOSIS — E119 Type 2 diabetes mellitus without complications: Secondary | ICD-10-CM

## 2011-12-26 DIAGNOSIS — Z1389 Encounter for screening for other disorder: Secondary | ICD-10-CM | POA: Insufficient documentation

## 2011-12-26 DIAGNOSIS — O34219 Maternal care for unspecified type scar from previous cesarean delivery: Secondary | ICD-10-CM | POA: Insufficient documentation

## 2011-12-26 DIAGNOSIS — Z6841 Body Mass Index (BMI) 40.0 and over, adult: Secondary | ICD-10-CM

## 2011-12-26 DIAGNOSIS — Z331 Pregnant state, incidental: Secondary | ICD-10-CM

## 2011-12-26 DIAGNOSIS — Z889 Allergy status to unspecified drugs, medicaments and biological substances status: Secondary | ICD-10-CM

## 2011-12-26 DIAGNOSIS — Z3689 Encounter for other specified antenatal screening: Secondary | ICD-10-CM

## 2011-12-26 DIAGNOSIS — Z880 Allergy status to penicillin: Secondary | ICD-10-CM

## 2011-12-26 DIAGNOSIS — Z98891 History of uterine scar from previous surgery: Secondary | ICD-10-CM

## 2011-12-26 DIAGNOSIS — O24919 Unspecified diabetes mellitus in pregnancy, unspecified trimester: Secondary | ICD-10-CM | POA: Insufficient documentation

## 2011-12-26 DIAGNOSIS — Z3490 Encounter for supervision of normal pregnancy, unspecified, unspecified trimester: Secondary | ICD-10-CM

## 2011-12-26 DIAGNOSIS — O10019 Pre-existing essential hypertension complicating pregnancy, unspecified trimester: Secondary | ICD-10-CM | POA: Insufficient documentation

## 2011-12-26 DIAGNOSIS — O09529 Supervision of elderly multigravida, unspecified trimester: Secondary | ICD-10-CM | POA: Insufficient documentation

## 2011-12-26 NOTE — Progress Notes (Signed)
[redacted]w[redacted]d DM controlled with Metformin. Chronic hypertension. AMA Previous C/S Morbid Obesity Patient had been to [facility] for Anatomy USS today and was advised to come to the office for the official report to be given by a provider. USS Result: IUP GA = [redacted]w[redacted]d. This corelation was made with EGA by LMP [redacted]w[redacted]d Anatomic evaluation is compromised by maternal habitus resulting in overall poor scan resolution. Incomplete evaluation of Fetal head, face, outflows, Abdomen, cord and distal extremities. No definite soft markers for DS are seen. F/U assessment recommended in 2- 3 weeks for better evaluation. Amniotic Fluid Normal. Questionable low lying placental position, initial reassessment is recommended at the time of Anatomy F/u Left ovary with several cystic foci suggested but poor overall resolution. Female gender. F/U anatomy USS at [facility] to be scheduled. Also needs Fetal Echo at 20 wks. ROB in 2 weeks.

## 2011-12-26 NOTE — Progress Notes (Signed)
Pt couldn't void will attempt after visit h20 given

## 2011-12-27 ENCOUNTER — Telehealth: Payer: Self-pay | Admitting: Obstetrics and Gynecology

## 2011-12-27 ENCOUNTER — Other Ambulatory Visit: Payer: Self-pay | Admitting: Obstetrics and Gynecology

## 2011-12-27 DIAGNOSIS — Z0489 Encounter for examination and observation for other specified reasons: Secondary | ICD-10-CM

## 2011-12-27 DIAGNOSIS — Z3689 Encounter for other specified antenatal screening: Secondary | ICD-10-CM

## 2011-12-27 NOTE — Telephone Encounter (Signed)
Spoke with pt rgd f/u anatomy 2-3 weeks per DEE, made app for pt at [facility] for 01/10/2012 at 10:15 . Pt aware of app and voice understanding. bt cma

## 2011-12-30 ENCOUNTER — Other Ambulatory Visit: Payer: Self-pay

## 2012-01-09 ENCOUNTER — Encounter: Payer: 59 | Admitting: Obstetrics and Gynecology

## 2012-01-10 ENCOUNTER — Ambulatory Visit (HOSPITAL_COMMUNITY): Payer: 59

## 2012-01-10 ENCOUNTER — Ambulatory Visit (HOSPITAL_COMMUNITY)
Admission: RE | Admit: 2012-01-10 | Discharge: 2012-01-10 | Disposition: A | Discharge status: Home / Self Care | Payer: 59 | Source: Ambulatory Visit | Attending: Obstetrics and Gynecology | Admitting: Obstetrics and Gynecology

## 2012-01-10 ENCOUNTER — Other Ambulatory Visit: Payer: Self-pay | Admitting: Obstetrics and Gynecology

## 2012-01-10 ENCOUNTER — Encounter: Payer: Self-pay | Admitting: Obstetrics and Gynecology

## 2012-01-10 ENCOUNTER — Ambulatory Visit (INDEPENDENT_AMBULATORY_CARE_PROVIDER_SITE_OTHER): Payer: 59 | Admitting: Obstetrics and Gynecology

## 2012-01-10 VITALS — BP 130/60 | Wt 244.5 lb

## 2012-01-10 DIAGNOSIS — I10 Essential (primary) hypertension: Secondary | ICD-10-CM

## 2012-01-10 DIAGNOSIS — Z9889 Other specified postprocedural states: Secondary | ICD-10-CM

## 2012-01-10 DIAGNOSIS — O24919 Unspecified diabetes mellitus in pregnancy, unspecified trimester: Secondary | ICD-10-CM

## 2012-01-10 DIAGNOSIS — Z0489 Encounter for examination and observation for other specified reasons: Secondary | ICD-10-CM

## 2012-01-10 DIAGNOSIS — Z98891 History of uterine scar from previous surgery: Secondary | ICD-10-CM

## 2012-01-10 DIAGNOSIS — Z331 Pregnant state, incidental: Secondary | ICD-10-CM

## 2012-01-10 DIAGNOSIS — O10019 Pre-existing essential hypertension complicating pregnancy, unspecified trimester: Secondary | ICD-10-CM | POA: Insufficient documentation

## 2012-01-10 DIAGNOSIS — O34219 Maternal care for unspecified type scar from previous cesarean delivery: Secondary | ICD-10-CM | POA: Insufficient documentation

## 2012-01-10 DIAGNOSIS — E669 Obesity, unspecified: Secondary | ICD-10-CM | POA: Insufficient documentation

## 2012-01-10 DIAGNOSIS — O09529 Supervision of elderly multigravida, unspecified trimester: Secondary | ICD-10-CM | POA: Insufficient documentation

## 2012-01-10 NOTE — Progress Notes (Signed)
Pt denies headaches,dizziness, or visual changes. Pt last took BP meds this am.

## 2012-01-10 NOTE — Progress Notes (Signed)
Pt without c/o FBS from 88-102 2 hr pp 90-93 Pt had fetal echo yesterday.  Results are pending RT 2 weeks.  Pt will bring her baseline 24 hr urine for protein Blood pressure well controlled and DM pretty well controlled

## 2012-01-23 ENCOUNTER — Ambulatory Visit (INDEPENDENT_AMBULATORY_CARE_PROVIDER_SITE_OTHER): Payer: 59 | Admitting: Obstetrics and Gynecology

## 2012-01-23 ENCOUNTER — Other Ambulatory Visit: Payer: 59

## 2012-01-23 VITALS — BP 108/62 | Wt 246.0 lb

## 2012-01-23 DIAGNOSIS — O24419 Gestational diabetes mellitus in pregnancy, unspecified control: Secondary | ICD-10-CM

## 2012-01-23 DIAGNOSIS — O99810 Abnormal glucose complicating pregnancy: Secondary | ICD-10-CM

## 2012-01-23 NOTE — Progress Notes (Signed)
109w3d Pt nervous movements FM decreased x 3 days  Pt didn't do 24 hr urine. Pt states she works and doesn't want to put urine in refrigerator. Informed pt she get a ice chest to store the urine. Pt agrees and will start collection tomorrow.

## 2012-01-23 NOTE — Progress Notes (Signed)
[redacted]w[redacted]d Had fetal echo that was normal Reports FBS mostly <90, some over, 2hr pp <120, did not bring logbook, on metformin  Reassured pt regarding FM - begin antenatal testing at 32wks  BP's controlled on labetalol  Discussed need for 24 hour urine, instructed pt she could also do collection over weekend and bring to office Monday or to [facility] at any time  Rv'd PTL sx's

## 2012-02-03 ENCOUNTER — Ambulatory Visit (INDEPENDENT_AMBULATORY_CARE_PROVIDER_SITE_OTHER): Payer: 59 | Admitting: Obstetrics and Gynecology

## 2012-02-03 ENCOUNTER — Telehealth: Payer: Self-pay | Admitting: Obstetrics and Gynecology

## 2012-02-03 ENCOUNTER — Other Ambulatory Visit: Payer: Self-pay | Admitting: Obstetrics and Gynecology

## 2012-02-03 VITALS — BP 100/60 | Wt 241.0 lb

## 2012-02-03 DIAGNOSIS — O0990 Supervision of high risk pregnancy, unspecified, unspecified trimester: Secondary | ICD-10-CM

## 2012-02-03 DIAGNOSIS — R21 Rash and other nonspecific skin eruption: Secondary | ICD-10-CM

## 2012-02-03 DIAGNOSIS — I10 Essential (primary) hypertension: Secondary | ICD-10-CM

## 2012-02-03 DIAGNOSIS — O24419 Gestational diabetes mellitus in pregnancy, unspecified control: Secondary | ICD-10-CM

## 2012-02-03 MED ORDER — NYSTATIN 100000 UNIT/GM EX POWD: Freq: Two times a day (BID) | CUTANEOUS | Status: DC

## 2012-02-03 MED ORDER — NYSTATIN-TRIAMCINOLONE 100000-0.1 UNIT/GM-% EX OINT: TOPICAL_OINTMENT | Freq: Every day | CUTANEOUS | Status: DC

## 2012-02-03 NOTE — Progress Notes (Signed)
Pt didi not bring CBG log Dextro stick 116

## 2012-02-03 NOTE — Progress Notes (Signed)
[redacted]w[redacted]d Doing well. Patient did not bring sugar log.  Says her fastings are less than 90.  Other sugars are less than 120.  Takes metformin. Still uncertain about VBAC. Return to office in 2 weeks. Dr. Stefano Gaul

## 2012-02-03 NOTE — Telephone Encounter (Signed)
Tc to pt regarding msg, consulted w/ SL, states pt does need an [place] for growth scheduled within a week.  Scheduled [place] @ [facility] for Thursday 02/06/12 @ 1015, pt informed and advised to call if that is not a good time for her.  Pt will call and r/s and advised to let us know so a f/u appt can be made, and also advised to keep appt for today.  Pt voices understanding.

## 2012-02-06 ENCOUNTER — Ambulatory Visit (HOSPITAL_COMMUNITY): Payer: 59

## 2012-02-07 ENCOUNTER — Telehealth: Payer: Self-pay | Admitting: Obstetrics and Gynecology

## 2012-02-07 ENCOUNTER — Ambulatory Visit (HOSPITAL_COMMUNITY)
Admission: RE | Admit: 2012-02-07 | Discharge: 2012-02-07 | Disposition: A | Discharge status: Home / Self Care | Payer: 59 | Source: Ambulatory Visit | Attending: Obstetrics and Gynecology | Admitting: Obstetrics and Gynecology

## 2012-02-07 DIAGNOSIS — O34219 Maternal care for unspecified type scar from previous cesarean delivery: Secondary | ICD-10-CM | POA: Insufficient documentation

## 2012-02-07 DIAGNOSIS — O09529 Supervision of elderly multigravida, unspecified trimester: Secondary | ICD-10-CM | POA: Insufficient documentation

## 2012-02-07 DIAGNOSIS — E669 Obesity, unspecified: Secondary | ICD-10-CM | POA: Insufficient documentation

## 2012-02-07 DIAGNOSIS — O24919 Unspecified diabetes mellitus in pregnancy, unspecified trimester: Secondary | ICD-10-CM | POA: Insufficient documentation

## 2012-02-07 DIAGNOSIS — O10019 Pre-existing essential hypertension complicating pregnancy, unspecified trimester: Secondary | ICD-10-CM | POA: Insufficient documentation

## 2012-02-07 DIAGNOSIS — O24419 Gestational diabetes mellitus in pregnancy, unspecified control: Secondary | ICD-10-CM

## 2012-02-07 NOTE — Telephone Encounter (Signed)
Spoke with pt rgd msg pt wants to know what she can take for a cold advised pt can take plain zyrtec, plain Claritin, plain robitussin, plain delsym, plain mucinex also advised can gargle warm salt water for sore throat also advised pt if temp 100.4 or higher call office for eval pt voice understanding

## 2012-02-10 ENCOUNTER — Ambulatory Visit (HOSPITAL_COMMUNITY): Payer: 59

## 2012-02-11 ENCOUNTER — Ambulatory Visit: Payer: 59 | Admitting: Obstetrics and Gynecology

## 2012-02-17 ENCOUNTER — Ambulatory Visit (INDEPENDENT_AMBULATORY_CARE_PROVIDER_SITE_OTHER): Payer: 59 | Admitting: Obstetrics and Gynecology

## 2012-02-17 VITALS — BP 110/62 | Wt 243.0 lb

## 2012-02-17 DIAGNOSIS — E119 Type 2 diabetes mellitus without complications: Secondary | ICD-10-CM

## 2012-02-17 DIAGNOSIS — O169 Unspecified maternal hypertension, unspecified trimester: Secondary | ICD-10-CM

## 2012-02-17 DIAGNOSIS — O24919 Unspecified diabetes mellitus in pregnancy, unspecified trimester: Secondary | ICD-10-CM

## 2012-02-17 DIAGNOSIS — Z9889 Other specified postprocedural states: Secondary | ICD-10-CM

## 2012-02-17 DIAGNOSIS — I10 Essential (primary) hypertension: Secondary | ICD-10-CM

## 2012-02-17 DIAGNOSIS — Z98891 History of uterine scar from previous surgery: Secondary | ICD-10-CM

## 2012-02-17 NOTE — Progress Notes (Signed)
[redacted]w[redacted]d Pt forgot log book again I informed her the importance of bringing log Pt says her fastings are 90's to 100's and 2hr after meals no greater than 115 FKCs Pt has u/s at [facility] secondary to body habitus and she reports having one last week.  It was done 9/27 with AGA fetus, low lying placenta 1.8cm and persistent renal pyelectasis which they rec f/u for at about 33wks Pt will likely want weekly BPPs instead of 2x/wk NSTs Pt also is interested in repeat c/s at 39wks, will let scheduler know Cont meds

## 2012-02-26 ENCOUNTER — Telehealth: Payer: Self-pay | Admitting: Obstetrics and Gynecology

## 2012-02-26 NOTE — Telephone Encounter (Signed)
Spoke with pt rgd msg pt states have poison oak/ivy advised pt can got to urgent care for eval or pcp pt voice understanding

## 2012-03-02 ENCOUNTER — Ambulatory Visit (INDEPENDENT_AMBULATORY_CARE_PROVIDER_SITE_OTHER): Payer: 59 | Admitting: Obstetrics and Gynecology

## 2012-03-02 ENCOUNTER — Encounter: Payer: Self-pay | Admitting: Obstetrics and Gynecology

## 2012-03-02 VITALS — BP 124/60 | Wt 249.0 lb

## 2012-03-02 DIAGNOSIS — E119 Type 2 diabetes mellitus without complications: Secondary | ICD-10-CM

## 2012-03-02 NOTE — Progress Notes (Signed)
[redacted]w[redacted]d AVS to get [facility]

## 2012-03-02 NOTE — Progress Notes (Signed)
[redacted]w[redacted]d Fasting blood sugars 88-112.  Two-hour p.c. Blood sugars 99-157.  Patient currently taking metformin.  She had another diabetes class in 2 days.  Most blood sugars are acceptable. Return office in 1 week. Ultrasound [facility] next week. Dr. Stefano Gaul

## 2012-03-04 ENCOUNTER — Encounter: Payer: 59 | Attending: Obstetrics and Gynecology | Admitting: Dietician

## 2012-03-04 VITALS — Ht 59.0 in | Wt 248.7 lb

## 2012-03-04 DIAGNOSIS — Z713 Dietary counseling and surveillance: Secondary | ICD-10-CM | POA: Insufficient documentation

## 2012-03-04 DIAGNOSIS — O99810 Abnormal glucose complicating pregnancy: Secondary | ICD-10-CM | POA: Insufficient documentation

## 2012-03-04 DIAGNOSIS — O24419 Gestational diabetes mellitus in pregnancy, unspecified control: Secondary | ICD-10-CM

## 2012-03-05 ENCOUNTER — Encounter: Payer: Self-pay | Admitting: Dietician

## 2012-03-05 NOTE — Progress Notes (Signed)
  Patient was seen on 03/04/2012 for Gestational Diabetes self-management class [facility]. The following learning objectives were met by the patient during this course:   States the definition of Gestational Diabetes  States why dietary management is important in controlling blood glucose  Describes the effects each nutrient has on blood glucose levels  Demonstrates ability to create a balanced meal plan  Demonstrates carbohydrate counting   States when to check blood glucose levels  Demonstrates proper blood glucose monitoring techniques  States the effect of stress and exercise on blood glucose levels  States the importance of limiting caffeine and abstaining from alcohol and smoking  Blood glucose monitor given: Accu-Chek [employer] Plus Lot # [id number] Exp: 05/12/2013 Blood glucose reading: 171 mg/dl at 1:61 PM  (Had finished her regular Orange Crush Soda)  Patient instructed to monitor glucose levels: FBS: 60 - <90 2 hour: <120  Patient received handouts:  Nutrition Diabetes and Pregnancy  Carbohydrate Counting List  Patient will be seen for follow-up as needed.

## 2012-03-07 NOTE — Progress Notes (Signed)
The patient did not show for her visit on 02/03/2012. Dr. Stefano Gaul

## 2012-03-09 ENCOUNTER — Encounter: Payer: 59 | Admitting: Obstetrics and Gynecology

## 2012-03-09 ENCOUNTER — Ambulatory Visit (HOSPITAL_COMMUNITY): Payer: 59

## 2012-03-09 ENCOUNTER — Other Ambulatory Visit: Payer: Self-pay | Admitting: Obstetrics and Gynecology

## 2012-03-09 ENCOUNTER — Ambulatory Visit (HOSPITAL_COMMUNITY)
Admission: RE | Admit: 2012-03-09 | Discharge: 2012-03-09 | Disposition: A | Discharge status: Home / Self Care | Payer: 59 | Source: Ambulatory Visit | Attending: Obstetrics and Gynecology | Admitting: Obstetrics and Gynecology

## 2012-03-09 DIAGNOSIS — E119 Type 2 diabetes mellitus without complications: Secondary | ICD-10-CM

## 2012-03-09 DIAGNOSIS — O34219 Maternal care for unspecified type scar from previous cesarean delivery: Secondary | ICD-10-CM | POA: Insufficient documentation

## 2012-03-09 DIAGNOSIS — O10019 Pre-existing essential hypertension complicating pregnancy, unspecified trimester: Secondary | ICD-10-CM | POA: Insufficient documentation

## 2012-03-09 DIAGNOSIS — E669 Obesity, unspecified: Secondary | ICD-10-CM | POA: Insufficient documentation

## 2012-03-09 DIAGNOSIS — O24919 Unspecified diabetes mellitus in pregnancy, unspecified trimester: Secondary | ICD-10-CM | POA: Insufficient documentation

## 2012-03-10 ENCOUNTER — Ambulatory Visit (INDEPENDENT_AMBULATORY_CARE_PROVIDER_SITE_OTHER): Payer: 59 | Admitting: Obstetrics and Gynecology

## 2012-03-10 ENCOUNTER — Encounter: Payer: Self-pay | Admitting: Obstetrics and Gynecology

## 2012-03-10 VITALS — BP 122/60 | Wt 248.0 lb

## 2012-03-10 DIAGNOSIS — Z331 Pregnant state, incidental: Secondary | ICD-10-CM

## 2012-03-10 DIAGNOSIS — E119 Type 2 diabetes mellitus without complications: Secondary | ICD-10-CM

## 2012-03-10 MED ORDER — GLYBURIDE 5 MG PO TABS: 5.0000 mg | ORAL_TABLET | Freq: Every day | ORAL | Status: DC

## 2012-03-10 NOTE — Progress Notes (Signed)
[redacted]w[redacted]d Pt unable to void.

## 2012-03-10 NOTE — Progress Notes (Signed)
[redacted]w[redacted]d Ultrasound on March 09, 2012 at maternal fetal medicine: Vertex, normal fluid, no longer marginal placenta previa, pyelectasis, growth 90th percentile. Blood sugars are still high. Discontinued metformin.  Again glyburide 5 mg each day. Return to office in 1 week. Blood type O+. Dr. Stefano Gaul

## 2012-03-11 ENCOUNTER — Telehealth: Payer: Self-pay | Admitting: Obstetrics and Gynecology

## 2012-03-11 NOTE — Telephone Encounter (Signed)
TC to pt.  Per Dr AVS. Pt to take Metformin tonight as usual and begin Glyberide in AM.  Pt verbalizes comprehension.

## 2012-03-11 NOTE — Telephone Encounter (Signed)
VM from pt. States switching form Metformin to [employer].   Always took Metformin at night.   Took last PM. Glyberide  Directions state to take in AM.  Should she start today skip Metformin tonight and start in AM.

## 2012-03-16 ENCOUNTER — Encounter: Payer: 59 | Admitting: Obstetrics and Gynecology

## 2012-03-19 ENCOUNTER — Telehealth: Payer: Self-pay | Admitting: Obstetrics and Gynecology

## 2012-03-19 NOTE — Telephone Encounter (Signed)
Repeat C/S Scheduled 05/18/12 @ 9:30 with SR/CNM. -Adrianne Pridgen

## 2012-03-20 ENCOUNTER — Ambulatory Visit (INDEPENDENT_AMBULATORY_CARE_PROVIDER_SITE_OTHER): Payer: 59 | Admitting: Obstetrics and Gynecology

## 2012-03-20 ENCOUNTER — Encounter: Payer: 59 | Admitting: Obstetrics and Gynecology

## 2012-03-20 ENCOUNTER — Encounter: Payer: Self-pay | Admitting: Obstetrics and Gynecology

## 2012-03-20 VITALS — BP 108/52 | Wt 251.0 lb

## 2012-03-20 DIAGNOSIS — E119 Type 2 diabetes mellitus without complications: Secondary | ICD-10-CM

## 2012-03-20 DIAGNOSIS — O24419 Gestational diabetes mellitus in pregnancy, unspecified control: Secondary | ICD-10-CM

## 2012-03-20 DIAGNOSIS — O99810 Abnormal glucose complicating pregnancy: Secondary | ICD-10-CM

## 2012-03-20 MED ORDER — GLYBURIDE 5 MG PO TABS: 5.0000 mg | ORAL_TABLET | Freq: Two times a day (BID) | ORAL | Status: DC

## 2012-03-20 NOTE — Patient Instructions (Signed)
Round Ligament Pain  The round ligament is made up of muscle and fibrous tissue. It is attached to the uterus near the fallopian tube. The round ligament is located on both sides of the uterus and helps support the position of the uterus. It usually begins in the second trimester of pregnancy when the uterus comes out of the pelvis. The pain can come and go until the baby is delivered. Round ligament pain is not a serious problem and does not cause harm to the baby.  CAUSE  During pregnancy the uterus grows the most from the second trimester to delivery. As it grows, it stretches and slightly twists the round ligaments. When the uterus leans from one side to the other, the round ligament on the opposite side pulls and stretches. This can cause pain.  SYMPTOMS   Pain can occur on one side or both sides. The pain is usually a short, sharp, and pinching-like. Sometimes it can be a dull, lingering and aching pain. The pain is located in the lower side of the abdomen or in the groin. The pain is internal and usually starts deep in the groin and moves up to the outside of the hip area. Pain can occur with:  · Sudden change in position like getting out of bed or a chair.  · Rolling over in bed.  · Coughing or sneezing.  · Walking too much.  · Any type of physical activity.  DIAGNOSIS   Your caregiver will make sure there are no serious problems causing the pain. When nothing serious is found, the symptoms usually indicate that the pain is from the round ligament.  TREATMENT   · Sit down and relax when the pain starts.  · Flex your knees up to your belly.  · Lay on your side with a pillow under your belly (abdomen) and another one between your legs.  · Sit in a hot bath for 15 to 20 minutes or until the pain goes away.  HOME CARE INSTRUCTIONS   · Only take over-the-counter or prescriptions medicines for pain, discomfort or fever as directed by your caregiver.  · Sit and stand slowly.  · Avoid long walks if it causes  pain.  · Stop or lessen your physical activities if it causes pain.  SEEK MEDICAL CARE IF:   · The pain does not go away with any of your treatment.  · You need stronger medication for the pain.  · You develop back pain that you did not have before with the side pain.  SEEK IMMEDIATE MEDICAL CARE IF:   · You develop a temperature of 102° F (38.9° C) or higher.  · You develop uterine contractions.  · You develop vaginal bleeding.  · You develop nausea, vomiting or diarrhea.  · You develop chills.  · You have pain when you urinate.  Document Released: 02/06/2008 Document Revised: 07/22/2011 Document Reviewed: 02/06/2008  Exit[employer]® Patient Information ©2013 [employer].

## 2012-03-20 NOTE — Progress Notes (Signed)
FBS 101-121 2 hr pp 99-120 DM take glyburide 5 mg BID [place] for growth with MfM in 1 week BPP @[place]  BP well controlled

## 2012-03-20 NOTE — Progress Notes (Signed)
Pt c/o right side vaginal pain when standing after sitting for a while.

## 2012-03-25 ENCOUNTER — Telehealth: Payer: Self-pay | Admitting: Obstetrics and Gynecology

## 2012-03-25 NOTE — Telephone Encounter (Signed)
Repeat C/S Scheduled for 05/21/12 @ 9:30 with AR.  -Adrianne Pridgen

## 2012-03-26 NOTE — Telephone Encounter (Signed)
Made app per [facility] for f/u

## 2012-03-30 ENCOUNTER — Telehealth: Payer: Self-pay | Admitting: Obstetrics and Gynecology

## 2012-03-30 DIAGNOSIS — O26849 Uterine size-date discrepancy, unspecified trimester: Secondary | ICD-10-CM

## 2012-03-30 NOTE — Telephone Encounter (Signed)
LM for pt to c/b regarding her message. 

## 2012-03-30 NOTE — Telephone Encounter (Signed)
TC from pt states Dr. Normand Sloop requested U/S to be completed at [facility]; however, no one has set up the appt for her yet. Appointment scheduled 04/03/12 @ 2 pm MFM for growth. Pt agrees and comprehends.

## 2012-04-01 ENCOUNTER — Other Ambulatory Visit: Payer: Self-pay | Admitting: Obstetrics and Gynecology

## 2012-04-03 ENCOUNTER — Ambulatory Visit (HOSPITAL_COMMUNITY)
Admission: RE | Admit: 2012-04-03 | Discharge: 2012-04-03 | Disposition: A | Discharge status: Home / Self Care | Payer: 59 | Source: Ambulatory Visit | Attending: Obstetrics and Gynecology | Admitting: Obstetrics and Gynecology

## 2012-04-03 ENCOUNTER — Other Ambulatory Visit: Payer: Self-pay | Admitting: Obstetrics and Gynecology

## 2012-04-03 VITALS — BP 108/68 | HR 108 | Wt 255.5 lb

## 2012-04-03 DIAGNOSIS — O10019 Pre-existing essential hypertension complicating pregnancy, unspecified trimester: Secondary | ICD-10-CM | POA: Insufficient documentation

## 2012-04-03 DIAGNOSIS — O358XX0 Maternal care for other (suspected) fetal abnormality and damage, not applicable or unspecified: Secondary | ICD-10-CM | POA: Insufficient documentation

## 2012-04-03 DIAGNOSIS — O26849 Uterine size-date discrepancy, unspecified trimester: Secondary | ICD-10-CM

## 2012-04-03 DIAGNOSIS — Z98891 History of uterine scar from previous surgery: Secondary | ICD-10-CM

## 2012-04-03 DIAGNOSIS — E119 Type 2 diabetes mellitus without complications: Secondary | ICD-10-CM

## 2012-04-03 DIAGNOSIS — O24919 Unspecified diabetes mellitus in pregnancy, unspecified trimester: Secondary | ICD-10-CM

## 2012-04-03 DIAGNOSIS — O169 Unspecified maternal hypertension, unspecified trimester: Secondary | ICD-10-CM

## 2012-04-03 DIAGNOSIS — O09529 Supervision of elderly multigravida, unspecified trimester: Secondary | ICD-10-CM

## 2012-04-03 DIAGNOSIS — E669 Obesity, unspecified: Secondary | ICD-10-CM | POA: Insufficient documentation

## 2012-04-03 DIAGNOSIS — Z889 Allergy status to unspecified drugs, medicaments and biological substances status: Secondary | ICD-10-CM

## 2012-04-03 DIAGNOSIS — O99210 Obesity complicating pregnancy, unspecified trimester: Secondary | ICD-10-CM | POA: Insufficient documentation

## 2012-04-03 DIAGNOSIS — Z880 Allergy status to penicillin: Secondary | ICD-10-CM

## 2012-04-03 DIAGNOSIS — Z363 Encounter for antenatal screening for malformations: Secondary | ICD-10-CM | POA: Insufficient documentation

## 2012-04-03 DIAGNOSIS — O34219 Maternal care for unspecified type scar from previous cesarean delivery: Secondary | ICD-10-CM | POA: Insufficient documentation

## 2012-04-03 DIAGNOSIS — Z6841 Body Mass Index (BMI) 40.0 and over, adult: Secondary | ICD-10-CM

## 2012-04-03 DIAGNOSIS — Z1389 Encounter for screening for other disorder: Secondary | ICD-10-CM | POA: Insufficient documentation

## 2012-04-03 DIAGNOSIS — O3660X0 Maternal care for excessive fetal growth, unspecified trimester, not applicable or unspecified: Secondary | ICD-10-CM | POA: Insufficient documentation

## 2012-04-03 NOTE — Progress Notes (Addendum)
NEVIN KOZUCH  was seen today for an ultrasound appointment.  See full report in [facility].  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

## 2012-04-07 ENCOUNTER — Ambulatory Visit (INDEPENDENT_AMBULATORY_CARE_PROVIDER_SITE_OTHER): Payer: 59 | Admitting: Obstetrics and Gynecology

## 2012-04-07 ENCOUNTER — Ambulatory Visit (INDEPENDENT_AMBULATORY_CARE_PROVIDER_SITE_OTHER): Payer: 59

## 2012-04-07 ENCOUNTER — Other Ambulatory Visit: Payer: Self-pay

## 2012-04-07 VITALS — BP 108/70 | Wt 256.0 lb

## 2012-04-07 DIAGNOSIS — O26849 Uterine size-date discrepancy, unspecified trimester: Secondary | ICD-10-CM

## 2012-04-07 DIAGNOSIS — O99810 Abnormal glucose complicating pregnancy: Secondary | ICD-10-CM

## 2012-04-07 DIAGNOSIS — O24419 Gestational diabetes mellitus in pregnancy, unspecified control: Secondary | ICD-10-CM

## 2012-04-07 NOTE — Progress Notes (Signed)
[redacted]w[redacted]d Pt has no complaints   

## 2012-04-07 NOTE — Progress Notes (Addendum)
[redacted]w[redacted]d Ultrasound: Single gestation, normal fluid, fetal pyelectasis noted, biophysical profile 8 out of 8. Patient reports that sugars have been better.  Fasting sugars less than 95.  Two-hour p.c. Sugar is less than 120.  Patient is currently taking glyburide. Return office in one week. Biophysical profile next week. The patient will need FMLA papers completed because of time that she has missed work due to abnormal blood sugars. Will make arrangements for patient to see a physical therapist because of continued hip pain. Dr. Stefano Gaul

## 2012-04-14 ENCOUNTER — Other Ambulatory Visit: Payer: Self-pay

## 2012-04-14 ENCOUNTER — Telehealth: Payer: Self-pay

## 2012-04-14 DIAGNOSIS — M25559 Pain in unspecified hip: Secondary | ICD-10-CM

## 2012-04-14 NOTE — Telephone Encounter (Signed)
Referral for Physical Therapy ordered.  [facility] CMA

## 2012-04-16 ENCOUNTER — Encounter: Payer: Self-pay | Admitting: Obstetrics and Gynecology

## 2012-04-16 ENCOUNTER — Ambulatory Visit (INDEPENDENT_AMBULATORY_CARE_PROVIDER_SITE_OTHER): Payer: 59

## 2012-04-16 ENCOUNTER — Ambulatory Visit (INDEPENDENT_AMBULATORY_CARE_PROVIDER_SITE_OTHER): Payer: 59 | Admitting: Obstetrics and Gynecology

## 2012-04-16 VITALS — BP 102/64 | Wt 258.0 lb

## 2012-04-16 DIAGNOSIS — O99810 Abnormal glucose complicating pregnancy: Secondary | ICD-10-CM

## 2012-04-16 DIAGNOSIS — O24419 Gestational diabetes mellitus in pregnancy, unspecified control: Secondary | ICD-10-CM

## 2012-04-16 DIAGNOSIS — Z9889 Other specified postprocedural states: Secondary | ICD-10-CM

## 2012-04-16 DIAGNOSIS — Z98891 History of uterine scar from previous surgery: Secondary | ICD-10-CM

## 2012-04-16 DIAGNOSIS — I10 Essential (primary) hypertension: Secondary | ICD-10-CM

## 2012-04-16 DIAGNOSIS — E119 Type 2 diabetes mellitus without complications: Secondary | ICD-10-CM

## 2012-04-16 NOTE — Progress Notes (Signed)
[redacted]w[redacted]d  C/O: Pain on the right ride of her stomach, pain worsens w/ movement sometimes.   Pt states her meter "free style light "has broken and she needs a new one. New one ordered Didn't bring sugar log.Was having  FBS 96-120, but now doing evening snack  Earlier and FBS closer to 96     Ultrasound shows:         [place] EDD: 05/25/2012            AFI: 20.38cm Normal 75TH%            Cervical length: 3.66 cm            Placenta localization: anterior            Fetal presentation: Vertex    Anatomy survey completed YES    Anatomy survey :SEE COMMENTS                Comments: Bilateral Pyelectasis is seen ( Right Kidney = 0.74 CM) , Left Kidney = 1.4CM (Mild Hydronrphrosis is seen)    Normal [place]. Next growth u/s to be done at MFM on 05/01/12

## 2012-04-23 ENCOUNTER — Ambulatory Visit (INDEPENDENT_AMBULATORY_CARE_PROVIDER_SITE_OTHER): Payer: 59 | Admitting: Obstetrics and Gynecology

## 2012-04-23 ENCOUNTER — Ambulatory Visit (INDEPENDENT_AMBULATORY_CARE_PROVIDER_SITE_OTHER): Payer: 59

## 2012-04-23 VITALS — BP 118/72 | Wt 262.0 lb

## 2012-04-23 DIAGNOSIS — O24419 Gestational diabetes mellitus in pregnancy, unspecified control: Secondary | ICD-10-CM

## 2012-04-23 DIAGNOSIS — O99810 Abnormal glucose complicating pregnancy: Secondary | ICD-10-CM

## 2012-04-23 DIAGNOSIS — O3680X0 Pregnancy with inconclusive fetal viability, not applicable or unspecified: Secondary | ICD-10-CM

## 2012-04-23 NOTE — Progress Notes (Signed)
[redacted]w[redacted]d  Pt wants to have GBS done [place].

## 2012-04-23 NOTE — Progress Notes (Signed)
[redacted]w[redacted]d Patient declines beta strep today.  We will do next visit. Diabetes currently on glyburide. Fasting blood sugar less than 88.  2 hour PCs are normal (the patient did not bring in log today). Ultrasound: Single gestation, vertex, normal fluid, pyelectasis is seen with mild hydronephrosis, cervix 3.45 cm, a physical profile 8 out of 8. The patient is scheduled for ultrasound at maternal fetal medicine next week.  She can have her weekly visit there and then see [place] in 2 weeks. Biophysical profile at maternal fetal medicine next week and then biophysical profile in our office in 2 weeks. Cesarean section is scheduled on May 18, 2012. The patient will have her flu shot at [facility] because of insurance reasons.  She was encouraged to have this done. Dr. Stefano Gaul

## 2012-04-30 ENCOUNTER — Telehealth: Payer: Self-pay | Admitting: Obstetrics and Gynecology

## 2012-04-30 NOTE — Telephone Encounter (Signed)
Tc to pt regarding msg.  Pt states that she is having a lot of swelling in her feet and ankles and was worried about preeclampsia.  Pt has a hx of HTN and is currently on Labetolol 100 mg BID.  Headaches comes and goes but may go away on its own or it will go away if she takes Tylenol.  Pt says she drinks about 80 oz of water a day and try to elevate feet at night, pt has an appt tomorrow w/ MFM for [place].  Pt denies visual changes or chest pain or SOB.  Consulted w/ VL, advised pt to keep appt w/ MFM tomorrow.  They will check her BP, if it is elevated, she is to be seen while she is @ [facility].  Pt to call w/ any concerns prior, to keep pushing fluids and when she gets home to elevate feet above heart level to help w/ swelling.  Pt voices agreement.

## 2012-05-01 ENCOUNTER — Ambulatory Visit (HOSPITAL_COMMUNITY)
Admission: RE | Admit: 2012-05-01 | Discharge: 2012-05-01 | Disposition: A | Discharge status: Home / Self Care | Payer: 59 | Source: Ambulatory Visit | Attending: Obstetrics and Gynecology | Admitting: Obstetrics and Gynecology

## 2012-05-01 VITALS — BP 130/70 | Wt 269.0 lb

## 2012-05-01 DIAGNOSIS — Z6841 Body Mass Index (BMI) 40.0 and over, adult: Secondary | ICD-10-CM

## 2012-05-01 DIAGNOSIS — Z363 Encounter for antenatal screening for malformations: Secondary | ICD-10-CM | POA: Insufficient documentation

## 2012-05-01 DIAGNOSIS — Z889 Allergy status to unspecified drugs, medicaments and biological substances status: Secondary | ICD-10-CM

## 2012-05-01 DIAGNOSIS — O24919 Unspecified diabetes mellitus in pregnancy, unspecified trimester: Secondary | ICD-10-CM | POA: Insufficient documentation

## 2012-05-01 DIAGNOSIS — O10019 Pre-existing essential hypertension complicating pregnancy, unspecified trimester: Secondary | ICD-10-CM | POA: Insufficient documentation

## 2012-05-01 DIAGNOSIS — Z1389 Encounter for screening for other disorder: Secondary | ICD-10-CM | POA: Insufficient documentation

## 2012-05-01 DIAGNOSIS — E669 Obesity, unspecified: Secondary | ICD-10-CM | POA: Insufficient documentation

## 2012-05-01 DIAGNOSIS — O09529 Supervision of elderly multigravida, unspecified trimester: Secondary | ICD-10-CM | POA: Insufficient documentation

## 2012-05-01 DIAGNOSIS — O358XX0 Maternal care for other (suspected) fetal abnormality and damage, not applicable or unspecified: Secondary | ICD-10-CM | POA: Insufficient documentation

## 2012-05-01 DIAGNOSIS — Z880 Allergy status to penicillin: Secondary | ICD-10-CM

## 2012-05-01 DIAGNOSIS — E119 Type 2 diabetes mellitus without complications: Secondary | ICD-10-CM

## 2012-05-01 DIAGNOSIS — O34219 Maternal care for unspecified type scar from previous cesarean delivery: Secondary | ICD-10-CM | POA: Insufficient documentation

## 2012-05-01 DIAGNOSIS — O3660X0 Maternal care for excessive fetal growth, unspecified trimester, not applicable or unspecified: Secondary | ICD-10-CM | POA: Insufficient documentation

## 2012-05-01 DIAGNOSIS — Z98891 History of uterine scar from previous surgery: Secondary | ICD-10-CM

## 2012-05-07 ENCOUNTER — Encounter: Payer: Self-pay | Admitting: Obstetrics and Gynecology

## 2012-05-07 ENCOUNTER — Ambulatory Visit (INDEPENDENT_AMBULATORY_CARE_PROVIDER_SITE_OTHER): Payer: 59 | Admitting: Obstetrics and Gynecology

## 2012-05-07 ENCOUNTER — Ambulatory Visit (INDEPENDENT_AMBULATORY_CARE_PROVIDER_SITE_OTHER): Payer: 59

## 2012-05-07 ENCOUNTER — Other Ambulatory Visit: Payer: Self-pay | Admitting: Obstetrics and Gynecology

## 2012-05-07 VITALS — BP 130/68 | Wt 269.0 lb

## 2012-05-07 DIAGNOSIS — O10019 Pre-existing essential hypertension complicating pregnancy, unspecified trimester: Secondary | ICD-10-CM

## 2012-05-07 DIAGNOSIS — O24919 Unspecified diabetes mellitus in pregnancy, unspecified trimester: Secondary | ICD-10-CM

## 2012-05-07 DIAGNOSIS — O34219 Maternal care for unspecified type scar from previous cesarean delivery: Secondary | ICD-10-CM

## 2012-05-07 DIAGNOSIS — O3660X0 Maternal care for excessive fetal growth, unspecified trimester, not applicable or unspecified: Secondary | ICD-10-CM

## 2012-05-07 DIAGNOSIS — Z331 Pregnant state, incidental: Secondary | ICD-10-CM

## 2012-05-07 NOTE — Progress Notes (Signed)
[redacted]w[redacted]d Pt has been having cramps near vagina x 3 days  Pt forgot glucose readings  Pt wants cervix checked

## 2012-05-07 NOTE — Progress Notes (Signed)
[redacted]w[redacted]d  GFM CHTN on Labetalol 100 mg BID Type 2 DB on Glyburide 5 mg BID: per pt this am FBS 87 and 116 PC. Reports good control GBS today Labor Signs Reviewed

## 2012-05-07 NOTE — Progress Notes (Signed)
Ultrasound shows:  SIUP  S=D     [place] EDD: 05/26/2011           AFI: 20.2           Cervical length: not measured           Placenta localization: anterior           Fetal presentation: vertex oblique U/S Comments: fetal head maternal left. Upper normal fluid, AFI of 20.2 cm = 80th% Score bpp 8/8 in 13 minutes.  CX not measured.

## 2012-05-08 ENCOUNTER — Encounter (HOSPITAL_COMMUNITY)
Admission: AD | Disposition: A | Discharge status: Home / Self Care | Payer: Self-pay | Source: Ambulatory Visit | Attending: Obstetrics and Gynecology

## 2012-05-08 ENCOUNTER — Encounter (HOSPITAL_COMMUNITY): Payer: Self-pay | Admitting: *Deleted

## 2012-05-08 ENCOUNTER — Encounter (HOSPITAL_COMMUNITY): Payer: Self-pay | Admitting: Anesthesiology

## 2012-05-08 ENCOUNTER — Inpatient Hospital Stay (HOSPITAL_COMMUNITY): Payer: 59 | Admitting: Anesthesiology

## 2012-05-08 ENCOUNTER — Inpatient Hospital Stay (HOSPITAL_COMMUNITY)
Admission: AD | Admit: 2012-05-08 | Discharge: 2012-05-12 | DRG: 765 | Disposition: A | Discharge status: Home / Self Care | Payer: 59 | Source: Ambulatory Visit | Attending: Obstetrics and Gynecology | Admitting: Obstetrics and Gynecology

## 2012-05-08 DIAGNOSIS — E119 Type 2 diabetes mellitus without complications: Secondary | ICD-10-CM | POA: Diagnosis present

## 2012-05-08 DIAGNOSIS — E669 Obesity, unspecified: Secondary | ICD-10-CM

## 2012-05-08 DIAGNOSIS — O34219 Maternal care for unspecified type scar from previous cesarean delivery: Principal | ICD-10-CM | POA: Diagnosis present

## 2012-05-08 DIAGNOSIS — IMO0002 Reserved for concepts with insufficient information to code with codable children: Secondary | ICD-10-CM | POA: Diagnosis not present

## 2012-05-08 DIAGNOSIS — O2432 Unspecified pre-existing diabetes mellitus in childbirth: Secondary | ICD-10-CM | POA: Diagnosis present

## 2012-05-08 DIAGNOSIS — O9903 Anemia complicating the puerperium: Secondary | ICD-10-CM | POA: Diagnosis not present

## 2012-05-08 DIAGNOSIS — R21 Rash and other nonspecific skin eruption: Secondary | ICD-10-CM

## 2012-05-08 DIAGNOSIS — O1002 Pre-existing essential hypertension complicating childbirth: Secondary | ICD-10-CM | POA: Diagnosis present

## 2012-05-08 DIAGNOSIS — O99214 Obesity complicating childbirth: Secondary | ICD-10-CM

## 2012-05-08 DIAGNOSIS — O99893 Other specified diseases and conditions complicating puerperium: Secondary | ICD-10-CM | POA: Diagnosis not present

## 2012-05-08 DIAGNOSIS — D649 Anemia, unspecified: Secondary | ICD-10-CM | POA: Diagnosis not present

## 2012-05-08 DIAGNOSIS — Z98891 History of uterine scar from previous surgery: Secondary | ICD-10-CM | POA: Diagnosis not present

## 2012-05-08 HISTORY — DX: Nausea with vomiting, unspecified: Z98.890

## 2012-05-08 HISTORY — DX: Nausea with vomiting, unspecified: R11.2

## 2012-05-08 LAB — CBC
Hemoglobin: 11 g/dL — ABNORMAL LOW (ref 12.0–15.0)
MCHC: 34 g/dL (ref 30.0–36.0)
WBC: 11.1 10*3/uL — ABNORMAL HIGH (ref 4.0–10.5)

## 2012-05-08 LAB — ABO/RH: ABO/RH(D): O POS

## 2012-05-08 LAB — RPR: RPR Ser Ql: NONREACTIVE

## 2012-05-08 LAB — GLUCOSE, CAPILLARY: Glucose-Capillary: 174 mg/dL — ABNORMAL HIGH (ref 70–99)

## 2012-05-08 SURGERY — Surgical Case
Anesthesia: Spinal | Site: Abdomen | Wound class: Clean Contaminated

## 2012-05-08 MED ORDER — CLINDAMYCIN PHOSPHATE 900 MG/50ML IV SOLN
INTRAVENOUS | Status: DC | PRN
  Administered 2012-05-08: 900 mg via INTRAVENOUS

## 2012-05-08 MED ORDER — ONDANSETRON HCL 4 MG/2ML IJ SOLN: 4.0000 mg | Freq: Three times a day (TID) | INTRAMUSCULAR | Status: DC | PRN

## 2012-05-08 MED ORDER — METHYLERGONOVINE MALEATE 0.2 MG PO TABS: 0.2000 mg | ORAL_TABLET | ORAL | Status: DC | PRN

## 2012-05-08 MED ORDER — SIMETHICONE 80 MG PO CHEW: 80.0000 mg | CHEWABLE_TABLET | ORAL | Status: DC | PRN

## 2012-05-08 MED ORDER — SCOPOLAMINE 1 MG/3DAYS TD PT72
1.0000 | MEDICATED_PATCH | Freq: Once | TRANSDERMAL | Status: AC
  Administered 2012-05-08: 1.5 mg via TRANSDERMAL

## 2012-05-08 MED ORDER — METHYLERGONOVINE MALEATE 0.2 MG/ML IJ SOLN: 0.2000 mg | INTRAMUSCULAR | Status: DC | PRN

## 2012-05-08 MED ORDER — SCOPOLAMINE 1 MG/3DAYS TD PT72
MEDICATED_PATCH | TRANSDERMAL | Status: AC
  Administered 2012-05-08: 1.5 mg via TRANSDERMAL
  Filled 2012-05-08: qty 1

## 2012-05-08 MED ORDER — LACTATED RINGERS IV SOLN
INTRAVENOUS | Status: DC | PRN
  Administered 2012-05-08 (×3): via INTRAVENOUS

## 2012-05-08 MED ORDER — NALBUPHINE HCL 10 MG/ML IJ SOLN
5.0000 mg | INTRAMUSCULAR | Status: DC | PRN
  Filled 2012-05-08: qty 1

## 2012-05-08 MED ORDER — ONDANSETRON HCL 4 MG/2ML IJ SOLN
4.0000 mg | INTRAMUSCULAR | Status: DC | PRN
  Administered 2012-05-08 (×2): 4 mg via INTRAVENOUS
  Filled 2012-05-08: qty 2

## 2012-05-08 MED ORDER — ONDANSETRON HCL 4 MG PO TABS: 4.0000 mg | ORAL_TABLET | ORAL | Status: DC | PRN

## 2012-05-08 MED ORDER — SODIUM BICARBONATE 8.4 % IV SOLN
INTRAVENOUS | Status: AC
  Filled 2012-05-08: qty 50

## 2012-05-08 MED ORDER — CLINDAMYCIN PHOSPHATE 900 MG/50ML IV SOLN
900.0000 mg | Freq: Once | INTRAVENOUS | Status: DC
  Filled 2012-05-08: qty 50

## 2012-05-08 MED ORDER — WITCH HAZEL-GLYCERIN EX PADS: 1.0000 "application " | MEDICATED_PAD | CUTANEOUS | Status: DC | PRN

## 2012-05-08 MED ORDER — BUPIVACAINE IN DEXTROSE 0.75-8.25 % IT SOLN
INTRATHECAL | Status: DC | PRN
  Administered 2012-05-08: 1.2 mL via INTRATHECAL

## 2012-05-08 MED ORDER — IBUPROFEN 600 MG PO TABS
600.0000 mg | ORAL_TABLET | Freq: Four times a day (QID) | ORAL | Status: DC
  Administered 2012-05-08 – 2012-05-12 (×17): 600 mg via ORAL
  Filled 2012-05-08 (×17): qty 1

## 2012-05-08 MED ORDER — NALOXONE HCL 1 MG/ML IJ SOLN: 1.0000 ug/kg/h | INTRAVENOUS | Status: DC | PRN

## 2012-05-08 MED ORDER — 0.9 % SODIUM CHLORIDE (POUR BTL) OPTIME
TOPICAL | Status: DC | PRN
  Administered 2012-05-08: 1000 mL

## 2012-05-08 MED ORDER — MEPERIDINE HCL 25 MG/ML IJ SOLN
INTRAMUSCULAR | Status: DC | PRN
  Administered 2012-05-08 (×2): 12.5 mg via INTRAVENOUS

## 2012-05-08 MED ORDER — PRENATAL MULTIVITAMIN CH
1.0000 | ORAL_TABLET | Freq: Every day | ORAL | Status: DC
  Administered 2012-05-08 – 2012-05-12 (×5): 1 via ORAL
  Filled 2012-05-08 (×5): qty 1

## 2012-05-08 MED ORDER — FENTANYL CITRATE 0.05 MG/ML IJ SOLN
INTRAMUSCULAR | Status: AC
  Filled 2012-05-08: qty 2

## 2012-05-08 MED ORDER — KETOROLAC TROMETHAMINE 60 MG/2ML IM SOLN
INTRAMUSCULAR | Status: AC
  Administered 2012-05-08: 60 mg via INTRAMUSCULAR
  Filled 2012-05-08: qty 2

## 2012-05-08 MED ORDER — PHENYLEPHRINE HCL 10 MG/ML IJ SOLN
INTRAMUSCULAR | Status: DC | PRN
  Administered 2012-05-08 (×5): 80 ug via INTRAVENOUS

## 2012-05-08 MED ORDER — MORPHINE SULFATE (PF) 0.5 MG/ML IJ SOLN
INTRAMUSCULAR | Status: DC | PRN
  Administered 2012-05-08: .1 mg via INTRATHECAL

## 2012-05-08 MED ORDER — KETOROLAC TROMETHAMINE 30 MG/ML IJ SOLN: 30.0000 mg | Freq: Four times a day (QID) | INTRAMUSCULAR | Status: AC | PRN

## 2012-05-08 MED ORDER — OXYTOCIN 10 UNIT/ML IJ SOLN
40.0000 [IU] | INTRAMUSCULAR | Status: DC | PRN
  Administered 2012-05-08: 40 [IU] via INTRAVENOUS

## 2012-05-08 MED ORDER — DIPHENHYDRAMINE HCL 50 MG/ML IJ SOLN: 25.0000 mg | INTRAMUSCULAR | Status: DC | PRN

## 2012-05-08 MED ORDER — METOCLOPRAMIDE HCL 5 MG/ML IJ SOLN
10.0000 mg | Freq: Once | INTRAMUSCULAR | Status: AC
  Administered 2012-05-08: 10 mg via INTRAVENOUS
  Filled 2012-05-08: qty 2

## 2012-05-08 MED ORDER — SODIUM CHLORIDE 0.9 % IJ SOLN: 3.0000 mL | INTRAMUSCULAR | Status: DC | PRN

## 2012-05-08 MED ORDER — FAMOTIDINE IN NACL 20-0.9 MG/50ML-% IV SOLN
20.0000 mg | Freq: Once | INTRAVENOUS | Status: AC
  Administered 2012-05-08: 20 mg via INTRAVENOUS
  Filled 2012-05-08: qty 50

## 2012-05-08 MED ORDER — DIPHENHYDRAMINE HCL 25 MG PO CAPS
25.0000 mg | ORAL_CAPSULE | ORAL | Status: DC | PRN
  Administered 2012-05-08: 25 mg via ORAL
  Filled 2012-05-08: qty 1

## 2012-05-08 MED ORDER — LIDOCAINE-EPINEPHRINE (PF) 2 %-1:200000 IJ SOLN
INTRAMUSCULAR | Status: AC
  Filled 2012-05-08: qty 20

## 2012-05-08 MED ORDER — OXYTOCIN 40 UNITS IN LACTATED RINGERS INFUSION - SIMPLE MED: 62.5000 mL/h | INTRAVENOUS | Status: AC

## 2012-05-08 MED ORDER — LANOLIN HYDROUS EX OINT: 1.0000 "application " | TOPICAL_OINTMENT | CUTANEOUS | Status: DC | PRN

## 2012-05-08 MED ORDER — ZOLPIDEM TARTRATE 5 MG PO TABS: 5.0000 mg | ORAL_TABLET | Freq: Every evening | ORAL | Status: DC | PRN

## 2012-05-08 MED ORDER — MEPERIDINE HCL 25 MG/ML IJ SOLN: 6.2500 mg | INTRAMUSCULAR | Status: DC | PRN

## 2012-05-08 MED ORDER — SIMETHICONE 80 MG PO CHEW
80.0000 mg | CHEWABLE_TABLET | Freq: Three times a day (TID) | ORAL | Status: DC
  Administered 2012-05-08 – 2012-05-12 (×17): 80 mg via ORAL

## 2012-05-08 MED ORDER — GLYBURIDE 5 MG PO TABS
5.0000 mg | ORAL_TABLET | Freq: Two times a day (BID) | ORAL | Status: DC
  Administered 2012-05-08 – 2012-05-12 (×8): 5 mg via ORAL
  Filled 2012-05-08 (×10): qty 1

## 2012-05-08 MED ORDER — LACTATED RINGERS IV SOLN
INTRAVENOUS | Status: DC
  Administered 2012-05-08: 03:00:00 via INTRAVENOUS

## 2012-05-08 MED ORDER — METOCLOPRAMIDE HCL 5 MG/ML IJ SOLN: 10.0000 mg | Freq: Three times a day (TID) | INTRAMUSCULAR | Status: DC | PRN

## 2012-05-08 MED ORDER — MEPERIDINE HCL 25 MG/ML IJ SOLN
INTRAMUSCULAR | Status: AC
  Filled 2012-05-08: qty 1

## 2012-05-08 MED ORDER — MORPHINE SULFATE 0.5 MG/ML IJ SOLN
INTRAMUSCULAR | Status: AC
  Filled 2012-05-08: qty 10

## 2012-05-08 MED ORDER — LABETALOL HCL 100 MG PO TABS
100.0000 mg | ORAL_TABLET | Freq: Two times a day (BID) | ORAL | Status: DC
  Administered 2012-05-08 – 2012-05-12 (×9): 100 mg via ORAL
  Filled 2012-05-08 (×11): qty 1

## 2012-05-08 MED ORDER — IBUPROFEN 600 MG PO TABS: 600.0000 mg | ORAL_TABLET | Freq: Four times a day (QID) | ORAL | Status: DC | PRN

## 2012-05-08 MED ORDER — BUPIVACAINE HCL (PF) 0.25 % IJ SOLN
INTRAMUSCULAR | Status: DC | PRN
  Administered 2012-05-08: 20 mL

## 2012-05-08 MED ORDER — LACTATED RINGERS IV SOLN
INTRAVENOUS | Status: DC | PRN
  Administered 2012-05-08: 04:00:00 via INTRAVENOUS

## 2012-05-08 MED ORDER — ONDANSETRON HCL 4 MG/2ML IJ SOLN
INTRAMUSCULAR | Status: DC | PRN
  Administered 2012-05-08: 4 mg via INTRAVENOUS

## 2012-05-08 MED ORDER — DIPHENHYDRAMINE HCL 25 MG PO CAPS: 25.0000 mg | ORAL_CAPSULE | Freq: Four times a day (QID) | ORAL | Status: DC | PRN

## 2012-05-08 MED ORDER — DIPHENHYDRAMINE HCL 50 MG/ML IJ SOLN: 12.5000 mg | INTRAMUSCULAR | Status: DC | PRN

## 2012-05-08 MED ORDER — MENTHOL 3 MG MT LOZG: 1.0000 | LOZENGE | OROMUCOSAL | Status: DC | PRN

## 2012-05-08 MED ORDER — FENTANYL CITRATE 0.05 MG/ML IJ SOLN
INTRAMUSCULAR | Status: DC | PRN
  Administered 2012-05-08: 15 ug via INTRATHECAL

## 2012-05-08 MED ORDER — OXYCODONE-ACETAMINOPHEN 5-325 MG PO TABS
1.0000 | ORAL_TABLET | ORAL | Status: DC | PRN
  Administered 2012-05-08: 1 via ORAL
  Administered 2012-05-09 (×2): 2 via ORAL
  Administered 2012-05-09: 1 via ORAL
  Administered 2012-05-10 – 2012-05-12 (×7): 2 via ORAL
  Filled 2012-05-08 (×8): qty 2
  Filled 2012-05-08 (×2): qty 1

## 2012-05-08 MED ORDER — FERROUS SULFATE 325 (65 FE) MG PO TABS
325.0000 mg | ORAL_TABLET | Freq: Two times a day (BID) | ORAL | Status: DC
  Administered 2012-05-08 – 2012-05-12 (×8): 325 mg via ORAL
  Filled 2012-05-08 (×8): qty 1

## 2012-05-08 MED ORDER — DIBUCAINE 1 % RE OINT: 1.0000 "application " | TOPICAL_OINTMENT | RECTAL | Status: DC | PRN

## 2012-05-08 MED ORDER — FENTANYL CITRATE 0.05 MG/ML IJ SOLN: 25.0000 ug | INTRAMUSCULAR | Status: DC | PRN

## 2012-05-08 MED ORDER — CITRIC ACID-SODIUM CITRATE 334-500 MG/5ML PO SOLN
30.0000 mL | Freq: Once | ORAL | Status: AC
  Administered 2012-05-08: 30 mL via ORAL
  Filled 2012-05-08: qty 15

## 2012-05-08 MED ORDER — SENNOSIDES-DOCUSATE SODIUM 8.6-50 MG PO TABS
2.0000 | ORAL_TABLET | Freq: Every day | ORAL | Status: DC
  Administered 2012-05-08 – 2012-05-11 (×4): 2 via ORAL

## 2012-05-08 MED ORDER — NALOXONE HCL 0.4 MG/ML IJ SOLN: 0.4000 mg | INTRAMUSCULAR | Status: DC | PRN

## 2012-05-08 MED ORDER — TETANUS-DIPHTH-ACELL PERTUSSIS 5-2.5-18.5 LF-MCG/0.5 IM SUSP
0.5000 mL | Freq: Once | INTRAMUSCULAR | Status: AC
  Administered 2012-05-09: 0.5 mL via INTRAMUSCULAR

## 2012-05-08 MED ORDER — KETOROLAC TROMETHAMINE 60 MG/2ML IM SOLN
60.0000 mg | Freq: Once | INTRAMUSCULAR | Status: AC | PRN
  Administered 2012-05-08: 60 mg via INTRAMUSCULAR

## 2012-05-08 MED ORDER — BUPIVACAINE IN DEXTROSE 0.75-8.25 % IT SOLN
INTRATHECAL | Status: AC
  Filled 2012-05-08: qty 2

## 2012-05-08 SURGICAL SUPPLY — 44 items
APL SKNCLS STERI-STRIP NONHPOA (GAUZE/BANDAGES/DRESSINGS) ×2
BENZOIN TINCTURE PRP APPL 2/3 (GAUZE/BANDAGES/DRESSINGS) ×3 IMPLANT
BINDER ABD UNIV 12 45-62 (WOUND CARE) IMPLANT
BINDER ABDOMINAL 46IN 62IN (WOUND CARE) ×2
BOOTIES KNEE HIGH SLOAN (MISCELLANEOUS) ×4 IMPLANT
CLOTH BEACON ORANGE TIMEOUT ST (SAFETY) ×2 IMPLANT
DRAIN JACKSON PRT FLT 10 (DRAIN) ×1 IMPLANT
DRAPE LG THREE QUARTER DISP (DRAPES) ×1 IMPLANT
DRESSING TELFA 8X3 (GAUZE/BANDAGES/DRESSINGS) ×1 IMPLANT
DRSG OPSITE POSTOP 4X10 (GAUZE/BANDAGES/DRESSINGS) IMPLANT
DRSG OPSITE POSTOP 4X12 (GAUZE/BANDAGES/DRESSINGS) ×1 IMPLANT
DURAPREP 26ML APPLICATOR (WOUND CARE) ×2 IMPLANT
ELECT REM PT RETURN 9FT ADLT (ELECTROSURGICAL) ×2
ELECTRODE REM PT RTRN 9FT ADLT (ELECTROSURGICAL) ×1 IMPLANT
EVACUATOR SILICONE 100CC (DRAIN) ×1 IMPLANT
EXTRACTOR VACUUM M CUP 4 TUBE (SUCTIONS) IMPLANT
GAUZE SPONGE 4X4 12PLY STRL LF (GAUZE/BANDAGES/DRESSINGS) ×2 IMPLANT
GLOVE BIOGEL PI IND STRL 7.0 (GLOVE) ×2 IMPLANT
GLOVE BIOGEL PI INDICATOR 7.0 (GLOVE) ×2
GLOVE ECLIPSE 6.5 STRL STRAW (GLOVE) ×3 IMPLANT
GOWN PREVENTION PLUS LG XLONG (DISPOSABLE) ×6 IMPLANT
KIT ABG SYR 3ML LUER SLIP (SYRINGE) IMPLANT
NDL HYPO 25X5/8 SAFETYGLIDE (NEEDLE) IMPLANT
NEEDLE HYPO 22GX1.5 SAFETY (NEEDLE) ×2 IMPLANT
NEEDLE HYPO 25X5/8 SAFETYGLIDE (NEEDLE) IMPLANT
NS IRRIG 1000ML POUR BTL (IV SOLUTION) ×4 IMPLANT
PACK C SECTION WH (CUSTOM PROCEDURE TRAY) ×2 IMPLANT
PAD ABD 7.5X8 STRL (GAUZE/BANDAGES/DRESSINGS) ×3 IMPLANT
PAD OB MATERNITY 4.3X12.25 (PERSONAL CARE ITEMS) ×1 IMPLANT
RETAINER VISCERAL (MISCELLANEOUS) ×1 IMPLANT
RTRCTR C-SECT PINK 25CM LRG (MISCELLANEOUS) ×2 IMPLANT
SLEEVE SCD COMPRESS KNEE MED (MISCELLANEOUS) ×1 IMPLANT
SPONGE LAP 18X18 X RAY DECT (DISPOSABLE) ×1 IMPLANT
STRIP CLOSURE SKIN 1/2X4 (GAUZE/BANDAGES/DRESSINGS) ×3 IMPLANT
SUT CHROMIC GUT AB #0 18 (SUTURE) IMPLANT
SUT MNCRL AB 3-0 PS2 27 (SUTURE) ×2 IMPLANT
SUT SILK 2 0 FSL 18 (SUTURE) ×1 IMPLANT
SUT VIC AB 0 CTX 36 (SUTURE) ×8
SUT VIC AB 0 CTX36XBRD ANBCTRL (SUTURE) ×2 IMPLANT
SUT VIC AB 1 CT1 36 (SUTURE) ×5 IMPLANT
SYR 20CC LL (SYRINGE) ×2 IMPLANT
TOWEL OR 17X24 6PK STRL BLUE (TOWEL DISPOSABLE) ×6 IMPLANT
TRAY FOLEY CATH 14FR (SET/KITS/TRAYS/PACK) ×2 IMPLANT
WATER STERILE IRR 1000ML POUR (IV SOLUTION) ×2 IMPLANT

## 2012-05-08 NOTE — H&P (Signed)
Monique Wallace is a morbidly obese 36 y.o.white female presenting at [redacted]w[redacted]d with CC of SROM at 0030, and onset of regular contractions since.  Fluid has been "pink." Large gush and continued leakage.  Pt last ate around 2030.  Cx at office yesterday 1cm/50%. No UTI or PIH s/s.  GFM.  No recent illness or fever.  Headaches a few weeks ago, but none recently.  Accompanied by her s.o. Prenatal course: Pt entered care at [redacted]w[redacted]d at [facility].  NOB work-up at [redacted]w[redacted]d.  Her pregnancy has been complicated by [facility] and Type II DM.  She declined aneuploidy screening.  She did not do a baseline 24 hr urine as recommended.  Pt was initially on Metformin for Type II DM, and remained on that until 29 weeks, when switched to Glyburide 5mg  po bid.  Anatomy u/s and serial growth u/s's have been at [facility] secondary to maternal habitus, and screen completed, but overall limited.  Renal pyelectasis has been noted, Lt greater than Rt, and persists.  Low-lying placenta also noted, but resolved on 29week u/s. Fetal echo done at [redacted]w[redacted]d and WNL.  Pt started antenatal testing at 32 weeks.  She has had lapses in bringing in log book w/ cbgs at office appointments.   OB Hx: G1=SAB 12 weeks G2=LTCS '05 at [redacted]w[redacted]d F=7+14; GDM, NICU for pneumonia G3=current  Maternal Medical History:  Reason for admission: Reason for admission: rupture of membranes and nausea.  Contractions: Onset was 1-2 hours ago.   Frequency: regular.   Perceived severity is moderate.    Fetal activity: Perceived fetal activity is normal.   Last perceived fetal movement was within the past hour.    Prenatal complications: Hypertension.   Prenatal Complications - Diabetes: type 2. Diabetes is managed by oral agent (monotherapy).      OB History    Grav Para Term Preterm Abortions TAB SAB Ect Mult Living   3 1 1  1  1   1      Past Medical History  Diagnosis Date  . Complication of anesthesia 02/2004    INCREASED N/V AFTER C/S  . Gestational diabetes   .  Diabetes mellitus     TYPE II;CURRENTLY TAKING METFORMIN 100MG  DAILY  . Hypertension     CURRENTLY TAKING LABETOLOL 100MG  BID  . Infection     yeast inf;not freq  . Infection     UTI X 1  . Depression 2005    HAS TAKEN CYMBALTA IN PAST;STARTED AS PP DEPRESSION  . Anxiety 2005;2010    TAKEN KLONIPIN IN PAST  . Gallstones 2005  . Abnormal Pap smear 1990s    RPT DONE WAS NORMAL;LAST PAP 02/2010  . Abnormal Pap smear 2003    HPV ON PAP  . Depression   . Obesity   . PONV (postoperative nausea and vomiting)    Past Surgical History  Procedure Date  . Cholecystectomy 2006  . Cesarean section 2005    GDM  . Shoulder surgery 2011;2012    LT SHOULDER X 2  . Wisdom tooth extraction     ALL 4 EXTRACTED  . No past surgeries    Family History: family history includes Cancer in her maternal grandmother; Hyperlipidemia in her maternal grandfather and maternal grandmother; Hypertension in her mother; Physical abuse in her maternal grandmother and mother; and Thyroid disease in her brother. Social History:  reports that she quit smoking about 9 years ago. Her smoking use included Cigarettes. She has never used smokeless tobacco. She reports  that she does not drink alcohol or use illicit drugs.   Prenatal Transfer Tool  Maternal Diabetes: Yes:  Diabetes Type:  Pre-pregnancy, Insulin/Medication controlled Genetic Screening: Declined Maternal Ultrasounds/Referrals: Abnormal:  Findings:   Fetal Kidney Anomalies Fetal Ultrasounds or other Referrals:  Fetal echo, Referred to Materal Fetal Medicine  Maternal Substance Abuse:  No Significant Maternal Medications:  Meds include: Other:  Significant Maternal Lab Results:  None Other Comments:  Labetalol 100mg  po bid & Glyburide 5mg  po bid; on Klonipin prior to pregnancy for anxiety  Review of Systems  Constitutional: Negative.   HENT: Negative.   Eyes: Negative.   Respiratory: Negative.   Cardiovascular: Negative.   Gastrointestinal: Positive  for nausea, vomiting and diarrhea.  Genitourinary: Negative.   Skin: Negative.   Neurological: Negative.     Dilation: 1 Effacement (%): 70 Exam by:: H.Ambrose Wile,CNM Blood pressure 134/84, pulse 86, temperature 97.5 F (36.4 C), temperature source Oral, resp. rate 20, height 4\' 10"  (1.473 m), weight 269 lb (122.018 kg), last menstrual period 08/19/2011, SpO2 99.00%. Maternal Exam:  Uterine Assessment: Contraction strength is moderate.  Contraction frequency is regular.   Abdomen: Patient reports no abdominal tenderness. Estimated fetal weight is 8-9 lbs.   Fetal presentation: vertex  Introitus: Normal vulva. Ferning test: positive.  Nitrazine test: not done. Amniotic fluid character: bloody.  Pelvis: adequate for delivery.   Cervix: Cervix evaluated by digital exam.     Fetal Exam Fetal Monitor Review: Mode: ultrasound.   Baseline rate: 160.  Variability: moderate (6-25 bpm).   Pattern: variable decelerations.    Fetal State Assessment: Category II - tracings are indeterminate.     Physical Exam  Constitutional: She is oriented to person, place, and time. She appears well-developed and well-nourished. She appears distressed.       Breathing and grimace w/ ctxs  HENT:  Head: Normocephalic and atraumatic.  Eyes: Pupils are equal, round, and reactive to light.       glasses  Cardiovascular: Normal rate.   Respiratory: Effort normal.  GI: Soft.       Gravid w/ large pannus  Genitourinary:       Cx: 1/70/-2 Moderate amt of pink-blood tinged amniotic fluid  Musculoskeletal: She exhibits edema. She exhibits no tenderness.       1+BLE pitting edema  Neurological: She is alert and oriented to person, place, and time. She has normal reflexes.  Skin: Skin is warm and dry.  Psychiatric: She has a normal mood and affect. Her behavior is normal. Judgment and thought content normal.    Prenatal labs: ABO, Rh: O/POS/-- (05/30 1204) Antibody: NEG (05/30 1204) Rubella: 55.5  (05/30 1204) RPR: NON REAC (05/30 1204)  HBsAg: NEGATIVE (05/30 1204)  HIV: NON REACTIVE (05/30 1204)  GBS:  pending  Assessment/Plan: 1. [redacted]w[redacted]d 2. SROM at 0030 3. Previous c/s and desire for repeat 4. Type II DM--on glyburide bid 5. CHTN--on Labetalol bid 6. Morbidly obese 7. Anxiety (on Klonipin before pregnancy) 8.  Cat II FHT 9. High PPH risk  1. Admit to [facility] w/ Dr. Estanislado Pandy as attending 2. Routine pre-op orders, type & cross 2 units for stand-by in OR 3. Risks of repeat c/s again rev'd w/ pt which include, but are not limited to infection, bleeding, damage to surrounding organs, and anesthesia complications.  Pt verbalized understanding of these risks, and desires to continue to proceed with repeat cesarean section for delivery 4. Prep for OR; anesthesia notified and Dr. Estanislado Pandy to follow  Antonietta Breach 05/08/2012,  2:17 AM

## 2012-05-08 NOTE — Anesthesia Postprocedure Evaluation (Signed)
  Anesthesia Post-op Note  Patient: Monique Wallace  Procedure(s) Performed: Procedure(s) (LRB) with comments: CESAREAN SECTION (N/A) - Repeat Cesarean Section Delivery Baby Boy @ [phone],   Patient Location: Women's Unit  Anesthesia Type:Spinal and Epidural  Level of Consciousness: awake, alert  and oriented  Airway and Oxygen Therapy: Patient Spontanous Breathing  Post-op Pain: mild  Post-op Assessment: Patient's Cardiovascular Status Stable, Respiratory Function Stable, Patent Airway, No signs of Nausea or vomiting and Pain level controlled  Post-op Vital Signs: stable  Complications: No apparent anesthesia complications

## 2012-05-08 NOTE — Op Note (Signed)
Preoperative diagnosis: Intrauterine pregnancy at 37 weeks and 3 days, previous cesarean, active labor, morbid obesity  Post operative diagnosis: Same  Anesthesia: Spinal  Anesthesiologist: Dr. Dana Allan  Procedure: Repeat low transverse cesarean section  Surgeon: Dr. Dois Davenport Pesach Frisch  Assistant: French Ana CNM  Estimated blood loss: 600 cc  Procedure:  After being informed of the planned procedure and possible complications including bleeding, infection, injury to other organs, informed consent is obtained. The patient is taken to OR #1 and given spinal anesthesia without complication. She is placed in the dorsal decubitus position with the pelvis tilted to the left. She is then prepped and draped in a sterile fashion. A Foley catheter is inserted in her bladder.  After assessing adequate level of anesthesia, we infiltrate the suprapubic area with 20 cc of Marcaine 0.25 and perform a Pfannenstiel incision which is brought down sharply to the fascia. The fascia is entered in a low transverse fashion. Linea alba is dissected. Peritoneum is entered in a midline fashion. An Alexis retractor is easily positioned.   The myometrium is then entered in a low transverse fashion, 2 cm above the vesico-uterine junction ; first with knife and then extended bluntly. Amniotic fluid is clear. We assist the birth of a female  infant in vertex OP presentation. Mouth and nose are suctioned. The baby is delivered. The cord is clamped and sectioned. The baby is given to the neonatologist present in the room.  10 cc of blood is drawn from the umbilical vein.The placenta is allowed to deliver spontaneously. It is complete and the cord has 3 vessels. We note a true knot in the cord.Uterine revision is negative.  We proceed with closure of the myometrium in 2 layers: First with a running locked suture of 0 Vicryl, then with a Lembert suture of 0 Vicryl imbricating the first one. Hemostasis is completed with  cauterization on peritoneal edges and 1 figure-of-eight stitch of 0-Vicryl.  Both paracolic gutters are cleaned. Both tubes and ovaries are assessed and normal. The pelvis is profusely irrigated with warm saline to confirm a satisfactory hemostasis.  Retractors and sponges are removed. Under fascia hemostasis is completed with cauterization. The fascia is then closed with 2 running sutures of 0 Vicryl meeting midline. The wound is irrigated with warm saline and hemostasis is completed with cauterization. A #10 JP drain is positioned in the wound and sutured to skin with 0-Silk.The skin is closed with a subcuticular suture of 3-0 Monocryl and Steri-Strips.  Instrument and sponge count is complete x2. Estimated blood loss is 600 cc.  The procedure is well tolerated by the patient who is taken to recovery room in a well and stable condition.  female baby named Stephenie Acres was born at 4:27 am and received an Apgar of 8  at 1 minute and 9 at 5 minutes and weighed 10 lbs.   Specimen: Placenta sent to L & D   Monique Wallace A MD 12/27/20133:23 AM

## 2012-05-08 NOTE — Transfer of Care (Signed)
Immediate Anesthesia Transfer of Care Note  Patient: Monique Wallace  Procedure(s) Performed: Procedure(s) (LRB) with comments: CESAREAN SECTION (N/A) - Repeat Cesarean Section Delivery Baby Boy @ [phone],   Patient Location: PACU  Anesthesia Type:Spinal  Level of Consciousness: awake, alert  and oriented  Airway & Oxygen Therapy: Patient Spontanous Breathing  Post-op Assessment: Report given to PACU RN  Post vital signs: Reviewed and stable  Complications: No apparent anesthesia complications

## 2012-05-08 NOTE — Progress Notes (Signed)
Subjective: Postpartum Day 0: Cesarean Delivery due to repeat in labor Patient up ad lib, reports no syncope or dizziness.  Does have some nausea, no vomiting.   Reports baby in NICU for hypoglycemia, weighed 10 lbs. Feeding:  Undecided at present Contraceptive plan:  Undecided  On Labetalol 100 mg po BID and Glyburide 5 mg po BID with meals before delivery.  Objective: Vital signs in last 24 hours: Temp:  [97.3 F (36.3 C)-98.3 F (36.8 C)] 98.2 F (36.8 C) (12/27 0830) Pulse Rate:  [71-112] 79  (12/27 0830) Resp:  [19-26] 20  (12/27 0830) BP: (130-184)/(68-116) 147/85 mmHg (12/27 0830) SpO2:  [84 %-99 %] 95 % (12/27 0830) Weight:  [269 lb (122.018 kg)] 269 lb (122.018 kg) (12/27 0149)  Physical Exam:  General: alert Lochia: appropriate Uterine Fundus: firm Incision: Dressing and abdominal binder intact and clean DVT Evaluation: No evidence of DVT seen on physical exam. Negative Homan's sign. JP drain:   125 cc from surgery until 8:30am--currently has approx 15 cc bloody drainage in bulb.   Basename 05/08/12 0225  HGB 11.0*  HCT 32.4*    Assessment/Plan: Status post Cesarean section day 0. Stable early postop Continue current care. NICU infant--on the way to see the baby now via wheelchair. Per orders, will continue Labetalol 100 mg po BID, restart Glyburide 5 mg po BID with meal at 5pm.    Monique Wallace 05/08/2012, 11:10 AM

## 2012-05-08 NOTE — MAU Note (Signed)
Pt leaking a pinkish tinged fluid-states present since 0030

## 2012-05-08 NOTE — MAU Note (Signed)
FHT 155-160 x after spinal placement

## 2012-05-08 NOTE — Anesthesia Procedure Notes (Signed)
CSE  Patient location during procedure: OR Start time: 05/08/2012 3:38 AM Staffing Performed by: anesthesiologist  Preanesthetic Checklist Completed: patient identified, site marked, surgical consent, pre-op evaluation, timeout performed, IV checked, risks and benefits discussed and monitors and equipment checked Spinal Block Patient position: sitting Prep: site prepped and draped and DuraPrep Patient monitoring: heart rate, continuous pulse ox and blood pressure Approach: midline Location: L3-4 Injection technique: catheter Needle Needle type: Pencan and Tuohy  Needle gauge: 25 G Needle length: 9 cm Needle insertion depth: 8 cm Catheter type: closed end flexible Catheter size: 19 g Catheter at skin depth: 14 cm Additional Notes CSE.  LOR to air with tuohy at 8 cm on first pass.  25 ga pencan passed with immediate return of clear free flow CSF.  Dose given and pencan removed.  Touhy flushed with 3 ml saline.  Catheter passed and immediately felt pop and saw heme in catheter.  Catheter removed, no heme from [place], tuohy flushed again with 3 ml saline.  Catheter passed again, flushed with saline.  Heme tinged saline , but not frank heme.  Test dose not given.  Spinal working well - will test dose epidural only if needed.  Jasmine December, MD

## 2012-05-08 NOTE — Anesthesia Preprocedure Evaluation (Signed)
Anesthesia Evaluation  Patient identified by MRN, date of birth, ID band Patient awake    Reviewed: Allergy & Precautions, H&P , NPO status , Patient's Chart, lab work & pertinent test results, reviewed documented beta blocker date and time   History of Anesthesia Complications (+) PONV  Airway Mallampati: II TM Distance: >3 FB Neck ROM: full    Dental  (+) Teeth Intact   Pulmonary neg pulmonary ROS,  breath sounds clear to auscultation        Cardiovascular hypertension, On Home Beta Blockers Rhythm:regular Rate:Normal     Neuro/Psych PSYCHIATRIC DISORDERS (anxiety/depression) negative neurological ROS     GI/Hepatic negative GI ROS, Neg liver ROS,   Endo/Other  diabetes, Type 2, Oral Hypoglycemic AgentsMorbid obesity  Renal/GU negative Renal ROS     Musculoskeletal   Abdominal   Peds  Hematology negative hematology ROS (+)   Anesthesia Other Findings Last ate at 8 pm - per Dr Estanislado Pandy patient needs to be operated upon urgently and cannot wait full 8 hrs to be NPO  Ordered reglan, pepcid and bicitra pre-op  Reproductive/Obstetrics (+) Pregnancy (h/o c/s x1, labor)                           Anesthesia Physical Anesthesia Plan  ASA: III and emergent  Anesthesia Plan: Combined Spinal and Epidural   Post-op Pain Management:    Induction:   Airway Management Planned:   Additional Equipment:   Intra-op Plan:   Post-operative Plan:   Informed Consent: I have reviewed the patients History and Physical, chart, labs and discussed the procedure including the risks, benefits and alternatives for the proposed anesthesia with the patient or authorized representative who has indicated his/her understanding and acceptance.     Plan Discussed with: Surgeon and CRNA  Anesthesia Plan Comments:         Anesthesia Quick Evaluation

## 2012-05-08 NOTE — Addendum Note (Signed)
Addendum  created 05/08/12 1355 by Lincoln Brigham, CRNA   Modules edited:Notes Section

## 2012-05-08 NOTE — Anesthesia Postprocedure Evaluation (Signed)
Anesthesia Post Note  Patient: Monique Wallace  Procedure(s) Performed: Procedure(s) (LRB): CESAREAN SECTION (N/A)  Anesthesia type: Spinal  Patient location: PACU  Post pain: Pain level controlled  Post assessment: Post-op Vital signs reviewed  Last Vitals:  Filed Vitals:   05/08/12 0630  BP: 169/84  Pulse: 71  Temp: 36.6 C  Resp: 19    Post vital signs: Reviewed  Level of consciousness: awake  Complications: No apparent anesthesia complications

## 2012-05-09 LAB — CBC
HCT: 27 % — ABNORMAL LOW (ref 36.0–46.0)
MCH: 29 pg (ref 26.0–34.0)
MCHC: 33.3 g/dL (ref 30.0–36.0)
MCV: 87.1 fL (ref 78.0–100.0)
RDW: 15.8 % — ABNORMAL HIGH (ref 11.5–15.5)

## 2012-05-09 LAB — GLUCOSE, CAPILLARY
Glucose-Capillary: 103 mg/dL — ABNORMAL HIGH (ref 70–99)
Glucose-Capillary: 98 mg/dL (ref 70–99)
Glucose-Capillary: 138 mg/dL — ABNORMAL HIGH (ref 70–99)

## 2012-05-09 NOTE — Progress Notes (Signed)
Subjective: Postpartum Day 1: Cesarean Delivery due to repeat in labor; Type 2 diabetic, on glyburide Patient up ad lib, reports no syncope or dizziness.  Baby doing well in NICU--hypoglycemia. Patient has been very focused on getting out of bed--doing well. Feeding:  Breast Contraceptive plan:  Undecided Objective: Vital signs in last 24 hours: Temp:  [98 F (36.7 C)-98.7 F (37.1 C)] 98.7 F (37.1 C) (12/28 0542) Pulse Rate:  [78-96] 96  (12/28 0542) Resp:  [18-20] 20  (12/28 0542) BP: (107-138)/(66-81) 137/77 mmHg (12/28 0542) SpO2:  [95 %-97 %] 95 % (12/27 2136)  Physical Exam:  General: alert Lochia: appropriate Uterine Fundus: firm Incision: Dressing CDI with abdominal binder in place due to large pannus DVT Evaluation: No evidence of DVT seen on physical exam. Negative Homan's sign. JP drain:   178 over last 24 hours, currently has approx 30 cc bloody drainage in drain   Basename 05/09/12 0530 05/08/12 0225  HGB 9.0* 11.0*  HCT 27.0* 32.4*   CBG (last 3)   Basename 05/09/12 1148 05/09/12 0612 05/08/12 1916  GLUCAP 162* 103* 100*    Assessment/Plan: Status post Cesarean section day 1 Type 2 diabetic--on Glyburide 5 mg po BID Chronic hypertension--on Labetalol 100 mg po BID Morbid obesity  Plan: Continue current care Will consult regarding management of JP drain on day of d/c (retain vs remove) Support for NICU infant Patient followed by primary MD at [facility] for diabetes--will plan f/u with that office after d/c for diabetes management.   Nigel Bridgeman 05/09/2012, 12:08 PM

## 2012-05-10 ENCOUNTER — Encounter (HOSPITAL_COMMUNITY): Payer: Self-pay | Admitting: *Deleted

## 2012-05-10 LAB — GLUCOSE, CAPILLARY: Glucose-Capillary: 142 mg/dL — ABNORMAL HIGH (ref 70–99)

## 2012-05-10 MED ORDER — INFLUENZA VIRUS VACC SPLIT PF IM SUSP
0.5000 mL | Freq: Once | INTRAMUSCULAR | Status: AC
  Administered 2012-05-10: 0.5 mL via INTRAMUSCULAR

## 2012-05-10 NOTE — Plan of Care (Signed)
Problem: Phase I Progression Outcomes Goal: Initial discharge plan identified Outcome: Completed/Met Date Met:  05/10/12 Pain control Vital signs stable No s/s of infection Understands self care Understands f/u care Goal: Other Phase I Outcomes/Goals Outcome: Progressing Passing flatus and or BM

## 2012-05-10 NOTE — Progress Notes (Signed)
Subjective: Postpartum Day 2: Cesarean Delivery due to repeat, in labor.  Type 2 DM, chronic hypertension. Patient up ad lib, reports no syncope or dizziness.  Visited baby this am, doing well.  Hypoglycemia improving--baby may be d/c'd with mom tomorrow.  Feeding:  Breast Contraceptive plan:  May want Nexplanon  Objective: Vital signs in last 24 hours: Temp:  [98.2 F (36.8 C)-98.8 F (37.1 C)] 98.8 F (37.1 C) (12/29 0607) Pulse Rate:  [84-93] 89  (12/29 0607) Resp:  [18] 18  (12/29 0607) BP: (114-140)/(63-85) 121/85 mmHg (12/29 0607) SpO2:  [97 %-99 %] 98 % (12/29 0607)  Filed Vitals:   05/09/12 1200 05/09/12 1800 05/09/12 2244 05/10/12 0607  BP: 114/63 140/76 117/69 121/85  Pulse: 84 89 93 89  Temp: 98.3 F (36.8 C) 98.2 F (36.8 C) 98.3 F (36.8 C) 98.8 F (37.1 C)  TempSrc: Oral Oral Oral Oral  Resp: 18 18 18 18   Height:      Weight:      SpO2: 99% 99% 97% 98%   CBG (last 3)   Basename 05/09/12 2244 05/09/12 1752 05/09/12 1148  GLUCAP 138* 98 162*  Fasting not yet done--patient has been in NICU, hasn't eaten yet.  Physical Exam:  General: alert Lochia: appropriate Uterine Fundus: firm Incision: Dressing CDI DVT Evaluation: No evidence of DVT seen on physical exam. Negative Homan's sign. JP drain:   90 cc last 24 hours--20 cc on 11-7 shift.   Basename 05/09/12 0530 05/08/12 0225  HGB 9.0* 11.0*  HCT 27.0* 32.4*    Assessment/Plan: Status post Cesarean section day 2. Doing well postoperatively.  Continue current care. Will determine plan for JP drain at d/c.   Nigel Bridgeman 05/10/2012, 9:46 AM

## 2012-05-11 ENCOUNTER — Encounter (HOSPITAL_COMMUNITY): Payer: Self-pay | Admitting: Obstetrics and Gynecology

## 2012-05-11 ENCOUNTER — Encounter (HOSPITAL_COMMUNITY)
Admission: RE | Admit: 2012-05-11 | Discharge: 2012-05-11 | Disposition: A | Discharge status: Home / Self Care | Payer: 59 | Source: Ambulatory Visit | Attending: Obstetrics and Gynecology | Admitting: Obstetrics and Gynecology

## 2012-05-11 DIAGNOSIS — O923 Agalactia: Secondary | ICD-10-CM | POA: Insufficient documentation

## 2012-05-11 LAB — GLUCOSE, CAPILLARY
Glucose-Capillary: 121 mg/dL — ABNORMAL HIGH (ref 70–99)
Glucose-Capillary: 90 mg/dL (ref 70–99)

## 2012-05-11 LAB — CULTURE, BETA STREP (GROUP B ONLY)

## 2012-05-11 MED ORDER — HYDROCHLOROTHIAZIDE 25 MG PO TABS
25.0000 mg | ORAL_TABLET | Freq: Every day | ORAL | Status: DC
  Administered 2012-05-11 – 2012-05-12 (×2): 25 mg via ORAL
  Filled 2012-05-11 (×3): qty 1

## 2012-05-11 NOTE — Care Management Note (Signed)
    Page 1 of 1   05/11/2012     1:29:15 PM   CARE MANAGEMENT NOTE 05/11/2012  Patient:  Monique Wallace, Monique Wallace   Account Number:  [id number]  Date Initiated:  05/11/2012  Documentation initiated by:  Roseanne Reno  Subjective/Objective Assessment:   Pt had requested to stay another night in the hospital and wanted to know if her insurance would cover it.     Action/Plan:   Discussed w/ pt, husband and pediatrician in room. Average length of staty is 96 hrs post c/section delivery.  Pt had c/section 05/08/12 and her dc date would be 05/12/12 if medically stable (96 hrs postop).   Anticipated DC Date:  05/12/2012   Anticipated DC Plan:  HOME/SELF CARE         Comments:  05/11/12  1200p  CM notified by pt's nurse of request to remain in hospital overnight.  Pt stated that she spoke w/ her insurance co and they suggested that she speak w/ Wallace case Production designer, theatre/television/film.  Pt delivered by c/section on 05/08/12. Average length of stay for Wallace c/section is 96 hrs post delivery.  If pt is medically stable, they could be dc'd earlier or if the pt is not medically stable for dc, then they may need to stay longer.  Pt and significant other voiced understanding.  Suggested that pt speak with her physician about remaining in the hospital overnight.  Pts nurse aware and will relay information to her physician. CM available to assist as needed.  TJohnson, RNBSN [phone]

## 2012-05-11 NOTE — Progress Notes (Signed)
Subjective: Postpartum Day 3: Cesarean Delivery Patient reports incisional pain.  She C/O swelling and trouble with feeding.  Objective: Vital signs in last 24 hours: Temp:  [98.2 F (36.8 C)-98.7 F (37.1 C)] 98.7 F (37.1 C) (12/30 0548) Pulse Rate:  [78-97] 78  (12/30 0548) Resp:  [18-20] 20  (12/30 0548) BP: (119-132)/(65-85) 127/79 mmHg (12/30 0548) SpO2:  [97 %-99 %] 97 % (12/30 0548)  Physical Exam:  General: no distress Lochia: appropriate Uterine Fundus: firm; difficult to palpate. Incision: Dressing is clean and dry. JP has 40 cc's since 9 pm yesterday. DVT Evaluation: No evidence of DVT seen on physical exam.   Basename 05/09/12 0530  HGB 9.0*  HCT 27.0*   FBS: 121  Assessment/Plan: Status post Cesarean section. Slow recovery after CS. Increased drainage still from JP. Increased blood sugars. Limited ambulation.  Will observe for additional day. Ambulate. Begin HCTZ 25 mg daily.  Shaunda Tipping V 05/11/2012, 11:58 AM

## 2012-05-12 ENCOUNTER — Encounter: Payer: 59 | Admitting: Obstetrics and Gynecology

## 2012-05-12 ENCOUNTER — Ambulatory Visit (INDEPENDENT_AMBULATORY_CARE_PROVIDER_SITE_OTHER): Payer: Medicaid Other | Admitting: Obstetrics and Gynecology

## 2012-05-12 ENCOUNTER — Other Ambulatory Visit: Payer: 59

## 2012-05-12 DIAGNOSIS — O34219 Maternal care for unspecified type scar from previous cesarean delivery: Secondary | ICD-10-CM

## 2012-05-12 LAB — TYPE AND SCREEN
ABO/RH(D): O POS
Antibody Screen: NEGATIVE
Unit division: 0

## 2012-05-12 LAB — GLUCOSE, CAPILLARY
Glucose-Capillary: 68 mg/dL — ABNORMAL LOW (ref 70–99)
Glucose-Capillary: 116 mg/dL — ABNORMAL HIGH (ref 70–99)

## 2012-05-12 MED ORDER — LABETALOL HCL 100 MG PO TABS: 100.0000 mg | ORAL_TABLET | Freq: Two times a day (BID) | ORAL | Status: DC

## 2012-05-12 MED ORDER — CEPHALEXIN 500 MG PO CAPS: 500.0000 mg | ORAL_CAPSULE | Freq: Two times a day (BID) | ORAL | Status: DC

## 2012-05-12 MED ORDER — NORETHINDRONE 0.35 MG PO TABS: 1.0000 | ORAL_TABLET | Freq: Every day | ORAL | Status: AC

## 2012-05-12 MED ORDER — OXYCODONE-ACETAMINOPHEN 5-325 MG PO TABS: 1.0000 | ORAL_TABLET | ORAL | Status: DC | PRN

## 2012-05-12 MED ORDER — FLUCONAZOLE 150 MG PO TABS: 150.0000 mg | ORAL_TABLET | ORAL | Status: DC

## 2012-05-12 MED ORDER — IBUPROFEN 600 MG PO TABS: 600.0000 mg | ORAL_TABLET | Freq: Four times a day (QID) | ORAL | Status: DC | PRN

## 2012-05-12 MED ORDER — CEPHALEXIN 500 MG PO CAPS
500.0000 mg | ORAL_CAPSULE | Freq: Once | ORAL | Status: AC
  Administered 2012-05-12: 500 mg via ORAL
  Filled 2012-05-12: qty 1

## 2012-05-12 NOTE — Progress Notes (Signed)
Patient ID: Monique Wallace, female   DOB: [birth year], 36 y.o.   MRN: [id number] States I got a shot here. Left forearm with 4 cm reddened warm firm to touch area noted on discharge assessment, home on keflex as discussed. PP depression discussed and medication hx for anxiety with lacatation RN Cordelia Pen at bedside and discussed medication if she desired and she left without further discussion. Lavera Guise, CNM

## 2012-05-12 NOTE — Discharge Summary (Signed)
Physician Discharge Summary  Patient ID: Monique Wallace MRN: [id number] DOB/AGE: Dec 05, 1975 36 y.o.  Admit date: 05/08/2012 Discharge date: 05/12/2012  Admission Diagnoses: [redacted]w[redacted]d srom, uc  Discharge Diagnoses:  Principal Problem:  *Status post repeat low transverse cesarean section anemia Cellulitis Hypertension Type 2 diabetes  Discharged Condition: stable  Hospital Course: [redacted]w[redacted]d srom, uc, repeat C/S,cellulitis to L arm, incision, home on keflex, anemia, normal involution, anemia  Consults: None  Significant Diagnostic Studies:  CBG (last 3)   Basename 05/12/12 1207 05/12/12 0622 05/11/12 2212  GLUCAP 116* 68* 83  Treatments: IV hydration  Discharge Exam: Blood pressure 138/69, pulse 85, temperature 97.8 F (36.6 C), temperature source Oral, resp. rate 18, height 4\' 10"  (1.473 m), weight 269 lb (122.018 kg), last menstrual period 08/19/2011, SpO2 100.00%, unknown if currently breastfeeding. Subjective: Postpartum Day 4 Cesarean Delivery Patient reports incisional pain, + flatus, + BM and no problems voiding pain meds are working.   S: comfortable, little bleeding, slept     Feeding Hemoglobin & Hematocrit     Component Value Date/Time   HGB 9.0* 05/09/2012 0530   HCT 27.0* 05/09/2012 0530    Temp:  [97.8 F (36.6 C)-98.4 F (36.9 C)] 97.8 F (36.6 C) (12/31 0604) Pulse Rate:  [85-94] 85  (12/31 0604) Resp:  [18-20] 18  (12/31 0604) BP: (138-150)/(65-81) 138/69 mmHg (12/31 0604) SpO2:  [98 %-100 %] 100 % (12/31 0604)  Physical Exam:  General: alert, cooperative and no distress Lochia: appropriate Uterine Fundus: firm Incision: well approximated without redness or edema at incision line with steri strips above entire incision line reddened warm, tender for 3 inches JP mod amount serous site nonreactive DVT Evaluation: Negative Homan's sign. Calf/Ankle edema is present +1 pitting edema Lungs clear bilaterally AP RRR Bowel sounds active  abd nontympanic Disposition: 01-Home or Self Care  Discharge Orders    Future Appointments: Provider: Department: Dept Phone: Center:   05/15/2012 8:30 AM Lavera Guise, [facility] [phone] None     Future Orders Please Complete By Expires   Discharge patient          Medication List     As of 05/12/2012 12:07 PM    STOP taking these medications         labetalol 100 MG tablet   Commonly known as: NORMODYNE      nystatin-triamcinolone ointment   Commonly known as: MYCOLOG      TAKE these medications         cephALEXin 500 MG capsule   Commonly known as: KEFLEX   Take 1 capsule (500 mg total) by mouth 2 (two) times daily.      fluconazole 150 MG tablet   Commonly known as: DIFLUCAN   Take 1 tablet (150 mg total) by mouth every 3 (three) days.      glyBURIDE 5 MG tablet   Commonly known as: DIABETA   Take 1 tablet (5 mg total) by mouth 2 (two) times daily with a meal.      ibuprofen 600 MG tablet   Commonly known as: ADVIL,MOTRIN   Take 1 tablet (600 mg total) by mouth every 6 (six) hours as needed for pain.      norethindrone 0.35 MG tablet   Commonly known as: MICRONOR,CAMILA,ERRIN   Take 1 tablet (0.35 mg total) by mouth daily. Start in 4 weeks take the same time each pm, if 3 hours out of that range could become pregnant if intercouse the next 48  hours      norethindrone 0.35 MG tablet   Commonly known as: MICRONOR,CAMILA,ERRIN   Take 1 tablet (0.35 mg total) by mouth daily. Start in 4 weeks take the same time each pm, if 3 hours out of that range could become pregnant if intercouse the next 48 hours      oxyCODONE-acetaminophen 5-325 MG per tablet   Commonly known as: PERCOCET/ROXICET   Take 1-2 tablets by mouth every 4 (four) hours as needed (moderate - severe pain).      PRENATAL VIT-FEPOLY-FA-DHA PO   Take 1 capsule by mouth daily.      will continue labetalol 100 mg BID     Follow-up Information    Follow up  with [facility]. (Friday 0830)    Contact information:   [address] [place] [address] [phone]       discussed pt status as documented with Dr. Su Hilt plan keflex assess for reaction prior to discharge, home on keflex risk of reaction to report hives, rash, short of breath, JP removal at office f/o discussed, keep incision dry, s/s bleeding, infection, pain to report. Plan f/o with PCP regarding type 2 diabetes.  SignedLavera Guise 05/12/2012, 12:07 PM

## 2012-05-12 NOTE — Progress Notes (Signed)
Patient discharged home.  Patient verbalized understanding of discharge instructions.  Patient and husband demonstrated how to empty and care for JP drain.

## 2012-05-13 ENCOUNTER — Encounter (HOSPITAL_COMMUNITY): Payer: Self-pay | Admitting: *Deleted

## 2012-05-13 ENCOUNTER — Inpatient Hospital Stay (HOSPITAL_COMMUNITY)
Admission: AD | Admit: 2012-05-13 | Discharge: 2012-05-13 | Disposition: A | Discharge status: Home / Self Care | Payer: BC Managed Care – PPO | Source: Ambulatory Visit | Attending: Obstetrics and Gynecology | Admitting: Obstetrics and Gynecology

## 2012-05-13 DIAGNOSIS — O909 Complication of the puerperium, unspecified: Secondary | ICD-10-CM | POA: Insufficient documentation

## 2012-05-13 DIAGNOSIS — O900 Disruption of cesarean delivery wound: Secondary | ICD-10-CM

## 2012-05-13 DIAGNOSIS — T8131XA Disruption of external operation (surgical) wound, not elsewhere classified, initial encounter: Secondary | ICD-10-CM

## 2012-05-13 MED ORDER — SERTRALINE HCL 25 MG PO TABS: 25.0000 mg | ORAL_TABLET | Freq: Every day | ORAL | Status: DC

## 2012-05-13 MED ORDER — LIDOCAINE HCL 2 % EX GEL
Freq: Once | CUTANEOUS | Status: AC
  Administered 2012-05-13: 5 via TOPICAL
  Filled 2012-05-13: qty 20

## 2012-05-13 NOTE — Progress Notes (Signed)
Upon exam, incision is open on Right side with serous sanguenous drainage, mild odor. Steri strips not intact. Soiled dressing removed along with loose steri strips. Sterile strip of telfa placed over incision until provider arrives.

## 2012-05-13 NOTE — Progress Notes (Signed)
Several calls made to [place], Engineering geologist, Tia Alert,  regarding home health wound care for this patient. Number for on call care mgmt received.

## 2012-05-13 NOTE — Progress Notes (Addendum)
History  Monique Wallace is a 37 y.o. [id number] at Unknown   Chief Complaint  Patient presents with  . Post-op C/S incision issues     States: felt wet, not hurting, JP with 30 cc last night, no fever or chills. States hx of anxiety had pp depression after last child used clonopin, denies thoughts of suicide or harming anyone, with good family support system. Breastfeeding is not working baby will not latch is pumping. Unknown   Chief Complaint  Patient presents with  . Post-op C/S incision issues    @SFHPI @  Prior to Admission medications   Medication Sig Start Date End Date Taking? Authorizing Provider  cephALEXin (KEFLEX) 500 MG capsule Take 1 capsule (500 mg total) by mouth 2 (two) times daily. 05/12/12  Yes Lavera Guise, CNM  fluconazole (DIFLUCAN) 150 MG tablet Take 1 tablet (150 mg total) by mouth every 3 (three) days. 05/12/12  Yes Lavera Guise, CNM  glyBURIDE (DIABETA) 5 MG tablet Take 1 tablet (5 mg total) by mouth 2 (two) times daily with a meal. 03/20/12  Yes Naima A Dillard, MD  ibuprofen (ADVIL,MOTRIN) 600 MG tablet Take 1 tablet (600 mg total) by mouth every 6 (six) hours as needed for pain. 05/12/12  Yes Lavera Guise, CNM  labetalol (NORMODYNE) 100 MG tablet Take 1 tablet (100 mg total) by mouth 2 (two) times daily. 05/12/12  Yes Lavera Guise, CNM  oxyCODONE-acetaminophen (PERCOCET/ROXICET) 5-325 MG per tablet Take 1-2 tablets by mouth every 4 (four) hours as needed (moderate - severe pain). 05/12/12  Yes Lavera Guise, CNM  PRENATAL VIT-FEPOLY-FA-DHA PO Take 1 capsule by mouth daily.   Yes Historical Provider, MD  norethindrone (ORTHO MICRONOR) 0.35 MG tablet Take 1 tablet (0.35 mg total) by mouth daily. Start in 4 weeks take the same time each pm, if 3 hours out of that range could become pregnant if intercouse the next 48 hours 05/12/12   Lavera Guise, CNM  norethindrone (ORTHO MICRONOR) 0.35 MG tablet Take 1 tablet (0.35 mg total) by mouth daily. Start in 4  weeks take the same time each pm, if 3 hours out of that range could become pregnant if intercouse the next 48 hours 05/12/12   Lavera Guise, CNM    Patient Active Problem List  Diagnosis  . Hypertension  . Diabetes mellitus  . Allergy to penicillin  . Allergy to phenergan  . BMI 50.0-59.9, adult  . Status post repeat low transverse cesarean section   Vitals:  Blood pressure 137/67, pulse 85, temperature 99 F (37.2 C), temperature source Oral, resp. rate 20, height 4\' 10"  (1.473 m), weight 265 lb (120.203 kg), SpO2 99.00%, unknown if currently breastfeeding. OB History    Grav Para Term Preterm Abortions TAB SAB Ect Mult Living   3 2 2  1  1   2       Past Medical History  Diagnosis Date  . Complication of anesthesia 02/2004    INCREASED N/V AFTER C/S  . Gestational diabetes   . Diabetes mellitus     TYPE II;CURRENTLY TAKING METFORMIN 100MG  DAILY  . Hypertension     CURRENTLY TAKING LABETOLOL 100MG  BID  . Infection     yeast inf;not freq  . Infection     UTI X 1  . Depression 2005    HAS TAKEN CYMBALTA IN PAST;STARTED AS PP DEPRESSION  . Anxiety 2005;2010    TAKEN KLONIPIN IN PAST  . Gallstones 2005  . Abnormal Pap smear 1990s  RPT DONE WAS NORMAL;LAST PAP 02/2010  . Abnormal Pap smear 2003    HPV ON PAP  . Depression   . Obesity   . PONV (postoperative nausea and vomiting)     Past Surgical History  Procedure Date  . Cholecystectomy 2006  . Cesarean section 2005    GDM  . Shoulder surgery 2011;2012    LT SHOULDER X 2  . Wisdom tooth extraction     ALL 4 EXTRACTED  . No past surgeries   . Cesarean section 05/08/2012    Procedure: CESAREAN SECTION;  Surgeon: Esmeralda Arthur, MD;  Location: [facility];  Service: Obstetrics;  Laterality: N/A;  Repeat Cesarean Section Delivery Baby Boy @ [phone],     Family History  Problem Relation Age of Onset  . Hypertension Mother   . Hyperlipidemia Maternal Grandmother   . Hyperlipidemia Maternal Grandfather   . Cancer  Maternal Grandmother     BONE  . Thyroid disease Brother     HYPERTHYROID  . Physical abuse Mother     SEXUAL  . Physical abuse Maternal Grandmother     DOMESTIC    History  Substance Use Topics  . Smoking status: Former Smoker    Types: Cigarettes    Quit date: 08/31/2002  . Smokeless tobacco: Never Used  . Alcohol Use: No     Comment: CURRENTLY ONLY DRANK 1-2 TIMES/YEAR;IN 20'S DRANK HEAVILY X 1 YEAR    Allergies:  Allergies  Allergen Reactions  . Lactose Intolerance (Gi)   . Penicillins Hives  . Phenergan (Promethazine Hcl) Swelling and Other (See Comments)    numbness    Prescriptions prior to admission  Medication Sig Dispense Refill  . cephALEXin (KEFLEX) 500 MG capsule Take 1 capsule (500 mg total) by mouth 2 (two) times daily.  13 capsule  0  . fluconazole (DIFLUCAN) 150 MG tablet Take 1 tablet (150 mg total) by mouth every 3 (three) days.  4 tablet  0  . glyBURIDE (DIABETA) 5 MG tablet Take 1 tablet (5 mg total) by mouth 2 (two) times daily with a meal.  60 tablet  4  . ibuprofen (ADVIL,MOTRIN) 600 MG tablet Take 1 tablet (600 mg total) by mouth every 6 (six) hours as needed for pain.  30 tablet  1  . labetalol (NORMODYNE) 100 MG tablet Take 1 tablet (100 mg total) by mouth 2 (two) times daily.  60 tablet  1  . oxyCODONE-acetaminophen (PERCOCET/ROXICET) 5-325 MG per tablet Take 1-2 tablets by mouth every 4 (four) hours as needed (moderate - severe pain).  30 tablet  0  . PRENATAL VIT-FEPOLY-FA-DHA PO Take 1 capsule by mouth daily.      . norethindrone (ORTHO MICRONOR) 0.35 MG tablet Take 1 tablet (0.35 mg total) by mouth daily. Start in 4 weeks take the same time each pm, if 3 hours out of that range could become pregnant if intercouse the next 48 hours  1 Package  11  . norethindrone (ORTHO MICRONOR) 0.35 MG tablet Take 1 tablet (0.35 mg total) by mouth daily. Start in 4 weeks take the same time each pm, if 3 hours out of that range could become pregnant if intercouse  the next 48 hours  1 Package  4    @ROS @ Physical Exam   Blood pressure 137/67, pulse 85, temperature 99 F (37.2 C), temperature source Oral, resp. rate 20, height 4\' 10"  (1.473 m), weight 265 lb (120.203 kg), SpO2 99.00%, unknown if currently breastfeeding.  @PHYSEXAMBYAGE2 @ Labs:  Recent Results (from the past 24 hour(s))  GLUCOSE, CAPILLARY   Collection Time   05/12/12 12:07 PM      Component Value Range   Glucose-Capillary 116 (*) 70 - 99 mg/dL   ASSESSMENT: Patient Active Problem List  Diagnosis  . Hypertension  . Diabetes mellitus  . Allergy to penicillin  . Allergy to phenergan  . BMI 50.0-59.9, adult  . Status post repeat low transverse cesarean section   Physical Examination:  Physical exam: Abdominal incision dehiscence 6 inches on R side with serous sangeuenous drainage, mild odor, tissue above and below incision line reddened, JP 1/2 full serous Dr. Su Hilt at bedside fascia intact she removed JP, placed steri strips to site, wound cleansed with peroxide and saline, plan packing with 4x4 and paper tape to incision. ED Course  Assessment/Plan Post OP Cesarean Section day 5 Incision dehiscence Dr. Su Hilt will come evaluate, updated per telephone 1000. Plan for home health coordination with [facility] for f/o, to office daily for repacking until home health starts see pt, discussed with husband need to change gauze and tape prn drainage. Home with zoloft per Dr. Su Hilt. Lavera Guise, CNM  Agree with above.  Wound explored and fascia intact.  Wound Cx sent.  Wound packed with saline soaked packing after irrigating with peroxide.  Pt to continue keflex initially started secondary to cellulitits.  HH to be arranged.  - AYR

## 2012-05-13 NOTE — MAU Note (Signed)
States some of the strips on her incision came off and she is afraid her incision is open.

## 2012-05-14 ENCOUNTER — Telehealth: Payer: Self-pay | Admitting: Obstetrics and Gynecology

## 2012-05-14 NOTE — Telephone Encounter (Signed)
Late entry.  VM from [facility]. Pt needs wound care today either in office or MAU if not being seen by Home Health. Call handled by BT.

## 2012-05-14 NOTE — Telephone Encounter (Signed)
Spoke with pt rgd wound check-up today at the office. Per pt already had a specialist come to the house to check her wounds. Per pt stated she has a app with [facility] tomorrow @ 8:30 am . pts voice understanding.

## 2012-05-15 ENCOUNTER — Ambulatory Visit (INDEPENDENT_AMBULATORY_CARE_PROVIDER_SITE_OTHER): Payer: BC Managed Care – PPO | Admitting: Obstetrics and Gynecology

## 2012-05-15 ENCOUNTER — Encounter: Payer: Self-pay | Admitting: Obstetrics and Gynecology

## 2012-05-15 VITALS — BP 130/80 | Wt 259.0 lb

## 2012-05-15 DIAGNOSIS — F3289 Other specified depressive episodes: Secondary | ICD-10-CM

## 2012-05-15 DIAGNOSIS — F329 Major depressive disorder, single episode, unspecified: Secondary | ICD-10-CM

## 2012-05-15 DIAGNOSIS — O900 Disruption of cesarean delivery wound: Secondary | ICD-10-CM

## 2012-05-15 DIAGNOSIS — O99345 Other mental disorders complicating the puerperium: Secondary | ICD-10-CM

## 2012-05-16 ENCOUNTER — Encounter: Payer: Self-pay | Admitting: Obstetrics and Gynecology

## 2012-05-16 DIAGNOSIS — O900 Disruption of cesarean delivery wound: Secondary | ICD-10-CM | POA: Insufficient documentation

## 2012-05-16 DIAGNOSIS — O99345 Other mental disorders complicating the puerperium: Secondary | ICD-10-CM | POA: Insufficient documentation

## 2012-05-16 LAB — WOUND CULTURE: Special Requests: NORMAL

## 2012-05-16 NOTE — Progress Notes (Signed)
S: comfortable with percocet and motrin, taking keflex, feeling anxious but better with zoloft, no fever or chills, would like wound vac a recommendation from Home Health staff      Bleeding very little      Pumps breasts      Incision to R with 6 1/2 inches open removed packing       wound is pink with no purlent drainage, no bleeding, the surrounding incision line if well approximated no redness, edema, or drainage      Packed with plan packing soaked in saline, 4/4x paper taped O VSS afebrile A dehiscense po cesarean section    Lactating    6 days PP    Hx of pp depression, anxiety P has home health RN coming in Saturday, order place with [facility] for wound vac, discussed technique for change of 4x4s if dressing saturates with pt and husband.. Collaboration with Dr. Stefano Gaul per telephone. Monique Wallace, CNM Late entry from [facility] 05/15/2012

## 2012-05-18 ENCOUNTER — Encounter: Payer: Self-pay | Admitting: Obstetrics and Gynecology

## 2012-05-18 ENCOUNTER — Ambulatory Visit (INDEPENDENT_AMBULATORY_CARE_PROVIDER_SITE_OTHER): Payer: BC Managed Care – PPO | Admitting: Obstetrics and Gynecology

## 2012-05-18 VITALS — BP 130/78 | Wt 252.0 lb

## 2012-05-18 DIAGNOSIS — O900 Disruption of cesarean delivery wound: Secondary | ICD-10-CM

## 2012-05-18 NOTE — Care Management Note (Signed)
    Page 1 of 1   05/15/2012     7:03:57 AM   CARE MANAGEMENT NOTE 05/15/2012  Patient:  Monique Wallace, Monique Wallace   Account Number:  [id number]  Date Initiated:  05/15/2012  Documentation initiated by:  Jayra Choyce  Subjective/Objective Assessment:     Action/Plan:   Anticipated DC Date:     Anticipated DC Plan:        DC Planning Services  CM consult      Choice offered to / List presented to:  C-1 Patient        HH arranged  HH-1 RN      [facility] agency  [facility]   Status of service:  Completed, signed off Medicare Important Message given?   (If response is "NO", the following Medicare IM given date fields will be blank) Date Medicare IM given:   Date Additional Medicare IM given:    Discharge Disposition:  HOME W HOME HEALTH SERVICES  Per UR Regulation:    If discussed at Long Length of Stay Meetings, dates discussed:    Comments:  05/13/12 Verdis Prime RN BSN MSN CCM Staff determined that pt had no preference of home health agencies, services arranged through [facility], relevant documentation faxed to [facility].

## 2012-05-18 NOTE — Progress Notes (Signed)
C/s wound care .  She has wet to dry dressings QD.  She is getting a wound vac tomorrow.  She reports her FBS are less than 100.  No fever or chills.  Pt reports she is unable to come here every day she can only come once a week BP 130/78  Wt 252 lb (114.306 kg) Incision. Half open.  Granulation tissue present.  Some gray tissue present which I debrided with a 4by 4.  Fascia is intact Dressing change done .  Wet to dry with saline and peroxide.  Pt will call tomorrow to schedule her follow up i one week. Pt will call for temp over 100.00 or purulent drainage Culture is sensitive to Keflex.  Pt finishing today.  Culture grew out proteus.

## 2012-05-21 ENCOUNTER — Encounter (HOSPITAL_COMMUNITY): Payer: Self-pay

## 2012-05-21 ENCOUNTER — Inpatient Hospital Stay (HOSPITAL_COMMUNITY): Admit: 2012-05-21 | Payer: 59 | Admitting: Obstetrics and Gynecology

## 2012-05-21 SURGERY — Surgical Case
Anesthesia: Regional

## 2012-05-22 ENCOUNTER — Telehealth: Payer: Self-pay | Admitting: Obstetrics and Gynecology

## 2012-05-22 ENCOUNTER — Telehealth: Payer: Self-pay

## 2012-05-22 NOTE — Telephone Encounter (Signed)
After speaking to pt's nurse, Florentina Addison, and also reviewing Dr. Redmond Baseman last notes, appt made for Tues the 14th @ 2:45. I LM for pt that appt. Was moved.  Melody Comas A

## 2012-05-22 NOTE — Telephone Encounter (Signed)
Ar pt 

## 2012-05-22 NOTE — Telephone Encounter (Signed)
LM for Katie, this pt's nurse, for her to cb re: the message she left me about getting this pt scheduled for wound care next week. Then I spoke to pt and was able to get her scheduled with SR on 05/28/2012. Per pt, she needs to see AR, SR or ND for this wound care, as pt has a wound vac. Melody Comas A

## 2012-05-25 ENCOUNTER — Encounter: Payer: BC Managed Care – PPO | Admitting: Obstetrics and Gynecology

## 2012-05-25 ENCOUNTER — Telehealth: Payer: Self-pay

## 2012-05-26 ENCOUNTER — Encounter: Payer: BC Managed Care – PPO | Admitting: Obstetrics and Gynecology

## 2012-05-28 ENCOUNTER — Ambulatory Visit: Payer: BC Managed Care – PPO | Admitting: Obstetrics and Gynecology

## 2012-05-28 ENCOUNTER — Encounter: Payer: BC Managed Care – PPO | Admitting: Obstetrics and Gynecology

## 2012-05-28 ENCOUNTER — Encounter: Payer: Self-pay | Admitting: Obstetrics and Gynecology

## 2012-05-28 VITALS — BP 120/76 | Ht 59.0 in | Wt 240.0 lb

## 2012-05-28 DIAGNOSIS — T8130XA Disruption of wound, unspecified, initial encounter: Secondary | ICD-10-CM

## 2012-05-28 MED ORDER — OXYCODONE-ACETAMINOPHEN 5-325 MG PO TABS: 1.0000 | ORAL_TABLET | ORAL | Status: DC | PRN

## 2012-05-28 MED ORDER — IBUPROFEN 600 MG PO TABS: 600.0000 mg | ORAL_TABLET | Freq: Four times a day (QID) | ORAL | Status: DC | PRN

## 2012-05-28 MED ORDER — NYSTATIN-TRIAMCINOLONE 100000-0.1 UNIT/GM-% EX OINT: TOPICAL_OINTMENT | Freq: Three times a day (TID) | CUTANEOUS | Status: AC | PRN

## 2012-05-28 NOTE — Progress Notes (Signed)
Subjective:    Monique Wallace is a 37 y.o. female, [id number], who presents for Wound Care, pt with wound vac.since 05/19/12 with home health care every Monday, Wednesday and Friday. Wound disruption has decreased 50 % in last week. Pt very encouraged.   The following portions of the patient's history were reviewed and updated as appropriate: allergies, current medications, past family history.  Review of Systems Pertinent items are noted in HPI.   Objective:    BP 120/76  Ht 4\' 11"  (1.499 m)  Wt 240 lb (108.863 kg)  BMI 48.47 kg/m2  Breastfeeding? No    Weight:  Wt Readings from Last 1 Encounters:  05/28/12 240 lb (108.863 kg)          BMI: Body mass index is 48.47 kg/(m^2).  General Appearance: Alert, appropriate appearance for age. No acute distress. Good spirits today. Incision: edges are clean and dry. Disruption about 4 cm. Under panus irritation   Assessment:    wound disruption healing well with wound vac    Plan:    Follow-up 2-3 weeks Percocet / ibuprofen refilled Mycolog prescribed  Silverio Lay MD

## 2012-06-11 ENCOUNTER — Encounter (HOSPITAL_COMMUNITY)
Admission: RE | Admit: 2012-06-11 | Discharge: 2012-06-11 | Disposition: A | Discharge status: Home / Self Care | Payer: BC Managed Care – PPO | Source: Ambulatory Visit | Attending: Obstetrics and Gynecology | Admitting: Obstetrics and Gynecology

## 2012-06-11 DIAGNOSIS — O923 Agalactia: Secondary | ICD-10-CM | POA: Insufficient documentation

## 2012-06-12 ENCOUNTER — Other Ambulatory Visit: Payer: Self-pay | Admitting: Obstetrics and Gynecology

## 2012-06-12 ENCOUNTER — Telehealth: Payer: Self-pay | Admitting: Obstetrics and Gynecology

## 2012-06-12 MED ORDER — OXYCODONE-ACETAMINOPHEN 5-325 MG PO TABS: 1.0000 | ORAL_TABLET | ORAL | Status: DC | PRN

## 2012-06-12 NOTE — Progress Notes (Signed)
Per DR AR, Rx for Zoloft 25 mg, 1 po qd # 30 with no Rf called to [phone].

## 2012-06-12 NOTE — Telephone Encounter (Signed)
LD to contact SR

## 2012-06-12 NOTE — Telephone Encounter (Signed)
Spoke with pt rgd msg pt states need refill on percocet and zoloft advised pt need eval first pt states have wound vac and was told by SR wouldn't have to return to office until wound vac removed so we can acess wound advised pt will have sr assistant consult with sr rgd refill on rx pt voice understanding

## 2012-06-12 NOTE — Telephone Encounter (Signed)
TC to pt.  Informed.

## 2012-06-12 NOTE — Telephone Encounter (Signed)
TC to pt. States wound is much better and is getting smaller.  States is taking 1-2 Percocet before wound care q 3 days and 1-2 are most days. Has sufficient supply until 06/15/12. States Zoloft is working fine and feels better.  Will inform DR AR. Per pharmacy pt has Rx for Percocet # 30 on 05/11/12 and 05/28/12. Per DR AR, OK to Rf Percocet #30 with Rf and Zoloft #30 , 0 Rf. Zoloft called to pharmacy. TC to pt. Will obtain Rx for Percocet 06/15/12.

## 2012-06-16 ENCOUNTER — Telehealth: Payer: Self-pay | Admitting: Obstetrics and Gynecology

## 2012-06-16 NOTE — Telephone Encounter (Signed)
TC to pt. States has been a busy week and has been unable to obtain RX. Made aware is still available.

## 2012-06-17 ENCOUNTER — Telehealth: Payer: Self-pay | Admitting: Obstetrics and Gynecology

## 2012-06-18 ENCOUNTER — Telehealth: Payer: Self-pay | Admitting: Obstetrics and Gynecology

## 2012-06-18 NOTE — Telephone Encounter (Signed)
Returned pt's VM. Pt states her brother will obtain Rx. Informed is available at my desk.

## 2012-06-19 ENCOUNTER — Telehealth: Payer: Self-pay | Admitting: Obstetrics and Gynecology

## 2012-06-19 NOTE — Telephone Encounter (Signed)
Tc to [facility] regarding SR's comments below.  Katie voices agreement, will go with that discussed and will advise pt to call the office today to set up appt with EP next week.

## 2012-06-19 NOTE — Telephone Encounter (Signed)
Message copied by Delon Sacramento on Fri Jun 19, 2012  8:51 AM ------      Message from: Silverio Lay      Created: Thu Jun 18, 2012  6:34 PM      Regarding: RE: Wound care       Yes. I agree with plan. Please schedule pt for wound check with EP next week.      SR      ----- Message -----         From: Delon Sacramento, RN         Sent: 06/17/2012   3:13 PM           To: Esmeralda Arthur, MD      Subject: Wound care                                               Dr. Lynford Humphrey,            We received a call from Katie from [facility], she is assisting pt with wound vac and wound care.  She says that the wound has closed pretty significantly and doubts that after Friday of this week that the wound vac will need to be used anymore.  Florentina Addison asks if it is ok to put Hydrogel on gauze to pack the wound and cover the wound w/ gauze or ADD pad and change the dressings three times a week as opposed to everyday?  Informed will check w/ you and will call her back.            Donnamae Jude

## 2012-06-30 ENCOUNTER — Encounter: Payer: Self-pay | Admitting: Obstetrics and Gynecology

## 2012-06-30 ENCOUNTER — Ambulatory Visit: Payer: BC Managed Care – PPO | Admitting: Obstetrics and Gynecology

## 2012-06-30 MED ORDER — OXYCODONE-ACETAMINOPHEN 5-325 MG PO TABS: 1.0000 | ORAL_TABLET | ORAL | Status: DC | PRN

## 2012-06-30 MED ORDER — IBUPROFEN 600 MG PO TABS: 600.0000 mg | ORAL_TABLET | Freq: Four times a day (QID) | ORAL | Status: DC | PRN

## 2012-06-30 MED ORDER — SERTRALINE HCL 25 MG PO TABS: 50.0000 mg | ORAL_TABLET | Freq: Every day | ORAL | Status: AC

## 2012-06-30 NOTE — Progress Notes (Signed)
Monique Wallace  is 6 weeks postpartum following a repeat cesarean section, low transverse incision at [redacted]w[redacted]d gestational weeks Date: 05/08/12 female baby named Christian delivered by SR, MD.  Breastfeeding: yes Bottlefeeding:  yes  Post-partum blues / depression:  yes  EPDS score: 10  History of abnormal Pap:  no  Last Pap: Date  unsure Gestational diabetes:  yes  Contraception:  Desires oral contraceptives (estrogen/progesterone) pt has rx but hasn't picked it up yet   Normal urinary function:  yes Normal GI function:  yes Returning to work:  no

## 2012-06-30 NOTE — Progress Notes (Signed)
Monique Wallace is a 37 y.o. female who presents for a postpartum visit.   Type of delivery:  Repeat C/S, by SR.  Had subsequent wound disruption, with wound care ongoing by home health. Still has open area in center of incision, but minimal drainage.  Definitely improving, but still having pain on movement and position changes.  Has stinging and stabbing pain in area of incision that is healing.    Does not feel ready to return to work--infant still has feeding issues; patient's parents living with them, with her father bipolar and currently manic; baby was in NICU for a time, now recovering from inverted milk duct in his breast.  Patient struggling with all these issues--feels she has had no time of peace since delivery, in light of all the pp issues, particularly with her incisional dehiscence.  Requests extension of pp leave.  Needs refills on Ibuprophen and Percocet--just for incisional pain.   Hx remarkable for: Patient Active Problem List  Diagnosis  . Hypertension  . Diabetes mellitus  . Allergy to penicillin  . Allergy to phenergan  . BMI 50.0-59.9, adult  . Status post repeat low transverse cesarean section  . Dehiscence of Cesarean section wound, postpartum  . Post partum depression     PPDS = 10, but coping will all the issues.  No SI/HI, already on Zoloft--only taking 25 mg daily, feels may need higher dose.  Contraception plan:  Plans OCPs, but hasn't picked up Rx yet.   I have fully reviewed the prenatal and intrapartum course   Patient has not been sexually active since delivery.   The following portions of the patient's history were reviewed and updated as appropriate: allergies, current medications, past family history, past medical history, past social history, past surgical history and problem list.  Review of Systems Pertinent items are noted in HPI.   Objective:    BP 140/80  Pulse 74  Wt 244 lb (110.678 kg)  BMI 49.26 kg/m2  Breastfeeding? Yes   General:  alert, cooperative and no distress     Lungs: clear to auscultation bilaterally  Heart:  regular rate and rhythm, S1, S2 normal, no murmur  Abdomen: soft, non-tender; bowel sounds normal; no masses,  no organomegaly.  Large pannus noted, with incision covered with gauze dressing.  Removed dressing for evaluation--approx 3-4 cm area of incision still open.  Has packing in at present, with no exudate or erythema of incision.  Rest of incision CDI, well-healed. Large area of pendulous tissue including mons, hangs over vulva.   Vulva:  normal  Vagina: normal vagina  Cervix:  normal  Uterus: normal size, contour, position, consistency, mobility, non-tender, well-involuted  Adnexa:  normal adnexa             Assessment:     Normal postpartum involution. Hx repeat C/S with wound dehiscence--undergoing wound care/packing q 2-3 days by home health Emotional issues  Pap smear:   not done at today's visit.   Will schedule this year (listed as done 7/13, but order had been cancelled--unsure why).  Patient declines at today's visit.  Plan:  Long discussion regarding issues--will extend pp leave x 2 weeks, then have re-evaluation by Dr. Su Hilt (per patient request) at that time.  May need additional 2 weeks, but will defer that decision to Dr. Su Hilt at that time. Rxs:  Ibuprophen and Percocet refilled. Increase Zoloft to more therapeutic dose of 50 mg po daily.  Reassess dose at NV or prn. Support to patient  for issues.  Nigel Bridgeman CNM, [place] 06/30/2012 12:27 PM

## 2012-07-10 ENCOUNTER — Telehealth: Payer: Self-pay | Admitting: Obstetrics and Gynecology

## 2012-07-10 NOTE — Telephone Encounter (Signed)
TC to [employer] home health nurse.  Requesting order to continue to see pt 3 more week to monitor wound healing and further teaching. Dr AR to be consulted.

## 2012-07-20 ENCOUNTER — Telehealth: Payer: Self-pay | Admitting: Obstetrics and Gynecology

## 2012-07-20 NOTE — Telephone Encounter (Signed)
Tc to pt rgd appt. Pt thought she was scheduled for today (07/20/12) to see AR and is unable to come in due to sick child but she is actually on the schedule for 07/27/12. Put pt back on the schedule for 07/27/12, pt agreeable.

## 2012-07-21 NOTE — Telephone Encounter (Signed)
Note to close. [name], Monique Wallace  

## 2012-07-27 ENCOUNTER — Encounter: Payer: Self-pay | Admitting: Obstetrics and Gynecology

## 2012-07-27 ENCOUNTER — Ambulatory Visit: Payer: BC Managed Care – PPO | Admitting: Obstetrics and Gynecology

## 2012-07-27 ENCOUNTER — Encounter: Payer: BC Managed Care – PPO | Admitting: Obstetrics and Gynecology

## 2012-07-27 VITALS — BP 130/82 | Temp 98.0°F | Wt 254.0 lb

## 2012-07-27 DIAGNOSIS — Z09 Encounter for follow-up examination after completed treatment for conditions other than malignant neoplasm: Secondary | ICD-10-CM

## 2012-07-27 MED ORDER — IBUPROFEN 600 MG PO TABS: 600.0000 mg | ORAL_TABLET | Freq: Four times a day (QID) | ORAL | Status: DC | PRN

## 2012-07-27 MED ORDER — OXYCODONE-ACETAMINOPHEN 5-325 MG PO TABS: 1.0000 | ORAL_TABLET | ORAL | Status: DC | PRN

## 2012-07-27 NOTE — Progress Notes (Signed)
Here to f/u wound which has finally healed completely.  Pt is just now moving around "ok".  Considering pts immobility for the last couple of months, I think it may be reasonable to gradually increase activity and rtw in 2wks - 3wks.  Filed Vitals:   07/27/12 1145  BP: 130/82  Temp: 98 F (36.7 C)   Well healed incision with tenderness  A/P 25month for AEX Refill on motrin and percocet May give rtw note for 08/15/12 May start micronor

## 2014-03-14 ENCOUNTER — Encounter: Payer: Self-pay | Admitting: Obstetrics and Gynecology

## 2016-12-22 ENCOUNTER — Emergency Department
Admission: EM | Admit: 2016-12-22 | Discharge: 2016-12-22 | Disposition: A | Discharge status: Home / Self Care | Payer: BLUE CROSS/BLUE SHIELD | Source: Home / Self Care | Attending: Family Medicine | Admitting: Family Medicine

## 2016-12-22 ENCOUNTER — Encounter: Payer: Self-pay | Admitting: Emergency Medicine

## 2016-12-22 DIAGNOSIS — M7712 Lateral epicondylitis, left elbow: Secondary | ICD-10-CM

## 2016-12-22 MED ORDER — MELOXICAM 15 MG PO TABS: 15.0000 mg | ORAL_TABLET | Freq: Every day | ORAL | 0 refills | Status: DC

## 2016-12-22 NOTE — Discharge Instructions (Signed)
°  Meloxicam (Mobic) is an antiinflammatory to help with pain and inflammation.  Do not take ibuprofen, Advil, Aleve, or any other medications that contain NSAIDs while taking meloxicam as this may cause stomach upset or even ulcers if taken in large amounts for an extended period of time.  ° °

## 2016-12-22 NOTE — ED Triage Notes (Signed)
Patient states she has left elbow pain since yesterday, with movement of elbow pain radiates into the forearm with use of the elbow. Patient taking Ibuprofen 800 mgs this AM some relief.

## 2016-12-22 NOTE — ED Provider Notes (Signed)
[name]    CSN: [id number] Arrival date & time: 12/22/16  1740     History   Chief Complaint Chief Complaint  Patient presents with  . Elbow Pain    HPI FRANCINE HANNAN is a 41 y.o. female.   HPI  SHARUNDA SALMON is a 41 y.o. female presenting to UC with c/o Left elbow pain that started yesterday w/o known injury.  Pain is worse on lateral aspect, worse with movement. Pain is 10/10 with movement and touch of lateral aspect of elbow. Pain is aching and sore.  She took 800mg  ibuprofen this morning with mild relief. She is right hand dominant but states she does have to lift various items and boxes at work. Denies working more or lifting more than usual. No hx of gout.   Past Medical History:  Diagnosis Date  . Abnormal Pap smear 1990s   RPT DONE WAS NORMAL;LAST PAP 02/2010  . Abnormal Pap smear 2003   HPV ON PAP  . Anxiety 2005;2010   TAKEN KLONIPIN IN PAST  . Complication of anesthesia 02/2004   INCREASED N/V AFTER C/S  . Depression 2005   HAS TAKEN CYMBALTA IN PAST;STARTED AS PP DEPRESSION  . Depression   . Diabetes mellitus    TYPE II;CURRENTLY TAKING METFORMIN 100MG  DAILY  . Gallstones 2005  . Gestational diabetes   . Hypertension    CURRENTLY TAKING LABETOLOL 100MG  BID  . Infection    yeast inf;not freq  . Infection    UTI X 1  . Obesity   . PONV (postoperative nausea and vomiting)     Patient Active Problem List   Diagnosis Date Noted  . Dehiscence of cesarean section wound, postpartum 05/16/2012  . 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  . Status post repeat low transverse cesarean section 05/08/2012  . Allergy to penicillin 11/28/2011  . Allergy to phenergan 11/28/2011  . BMI 50.0-59.9, adult (HCC) 11/28/2011  . Hypertension 10/10/2011  . Diabetes mellitus (HCC) 10/10/2011    Past Surgical History:  Procedure Laterality Date  . CESAREAN SECTION  2005   GDM  . CESAREAN SECTION  05/08/2012   Procedure: CESAREAN SECTION;  Surgeon:  Esmeralda Arthur, MD;  Location: [facility];  Service: Obstetrics;  Laterality: N/A;  Repeat Cesarean Section Delivery Baby Boy @ [phone],   . CHOLECYSTECTOMY  2006  . NO PAST SURGERIES    . SHOULDER SURGERY  2011;2012   LT SHOULDER X 2  . WISDOM TOOTH EXTRACTION     ALL 4 EXTRACTED    OB History    Gravida Para Term Preterm AB Living   3 2 2   1 2    SAB TAB Ectopic Multiple Live Births   1       2       Home Medications    Prior to Admission medications   Medication Sig Start Date End Date Taking? Authorizing Provider  cephALEXin (KEFLEX) 500 MG capsule Take 1 capsule (500 mg total) by mouth 2 (two) times daily. 05/12/12   Lavera Guise, CNM  fluconazole (DIFLUCAN) 150 MG tablet Take 1 tablet (150 mg total) by mouth every 3 (three) days. 05/12/12   Lavera Guise, CNM  glyBURIDE (DIABETA) 5 MG tablet Take 1 tablet (5 mg total) by mouth 2 (two) times daily with a meal. 03/20/12   Dillard, Samule Ohm, MD  ibuprofen (ADVIL,MOTRIN) 600 MG tablet Take 1 tablet (600 mg total) by mouth every 6 (six) hours as needed for pain. 06/30/12  Nigel Bridgeman, CNM  ibuprofen (ADVIL,MOTRIN) 600 MG tablet Take 1 tablet (600 mg total) by mouth every 6 (six) hours as needed for pain. 07/27/12   Osborn Coho, MD  labetalol (NORMODYNE) 100 MG tablet Take 1 tablet (100 mg total) by mouth 2 (two) times daily. 05/12/12   Lavera Guise, CNM  meloxicam (MOBIC) 15 MG tablet Take 1 tablet (15 mg total) by mouth daily. For at least 5 days, then daily as needed for pain 12/22/16   Lurene Shadow, PA-C  norethindrone (ORTHO MICRONOR) 0.35 MG tablet Take 1 tablet (0.35 mg total) by mouth daily. Start in 4 weeks take the same time each pm, if 3 hours out of that range could become pregnant if intercouse the next 48 hours 05/12/12   Lavera Guise, CNM  norethindrone (ORTHO MICRONOR) 0.35 MG tablet Take 1 tablet (0.35 mg total) by mouth daily. Start in 4 weeks take the same time each pm, if 3 hours out of that range could become  pregnant if intercouse the next 48 hours 05/12/12   Lavera Guise, CNM  nystatin-triamcinolone ointment [facility]) Apply topically 3 (three) times daily as needed. 05/28/12   Silverio Lay, MD  oxyCODONE-acetaminophen (PERCOCET/ROXICET) 5-325 MG per tablet Take 1-2 tablets by mouth every 4 (four) hours as needed (moderate - severe pain). 07/27/12   Osborn Coho, MD  PRENATAL VIT-FEPOLY-FA-DHA PO Take 1 capsule by mouth daily.    [provider]  sertraline (ZOLOFT) 25 MG tablet Take 2 tablets (50 mg total) by mouth daily. 06/30/12   Nigel Bridgeman, CNM    Family History Family History  Problem Relation Age of Onset  . Hypertension Mother   . Physical abuse Mother        SEXUAL  . Hyperlipidemia Maternal Grandmother   . Cancer Maternal Grandmother        BONE  . Physical abuse Maternal Grandmother        DOMESTIC  . Hyperlipidemia Maternal Grandfather   . Thyroid disease Brother        HYPERTHYROID    Social History Social History  Substance Use Topics  . Smoking status: Former Smoker    Types: Cigarettes    Quit date: 08/31/2002  . Smokeless tobacco: Never Used  . Alcohol use No     Comment: CURRENTLY ONLY DRANK 1-2 TIMES/YEAR;IN 20'S DRANK HEAVILY X 1 YEAR     Allergies   Lactose intolerance (gi); Penicillins; and Phenergan [promethazine hcl]   Review of Systems Review of Systems  Constitutional: Negative for chills and fever.  Musculoskeletal: Positive for arthralgias and myalgias. Negative for joint swelling.  Skin: Negative for color change, rash and wound.  Neurological: Positive for weakness (Left arm due to pain). Negative for numbness.     Physical Exam Triage Vital Signs ED Triage Vitals  Enc Vitals Group     BP 12/22/16 1804 (!) 141/89     Pulse Rate 12/22/16 1804 88     Resp 12/22/16 1804 16     Temp 12/22/16 1804 98.6 F (37 C)     Temp Source 12/22/16 1804 Oral     SpO2 12/22/16 1804 98 %     Weight 12/22/16 1803 239 lb (108.4 kg)      Height 12/22/16 1803 5\' 2"  (1.575 m)     Head Circumference --      Peak Flow --      Pain Score 12/22/16 1803 10     Pain Loc --  Pain Edu? --      Excl. in GC? --    No data found.   Updated Vital Signs BP (!) 141/89 (BP Location: Left Arm)   Pulse 88   Temp 98.6 F (37 C) (Oral)   Resp 16   Ht 5\' 2"  (1.575 m)   Wt 239 lb (108.4 kg)   LMP 12/22/2016   SpO2 98%   BMI 43.71 kg/m   Visual Acuity Right Eye Distance:   Left Eye Distance:   Bilateral Distance:    Right Eye Near:   Left Eye Near:    Bilateral Near:     Physical Exam  Constitutional: She is oriented to person, place, and time. She appears well-developed and well-nourished.  HENT:  Head: Normocephalic and atraumatic.  Eyes: EOM are normal.  Neck: Normal range of motion.  Cardiovascular: Normal rate.   Pulses:      Radial pulses are 2+ on the left side.  Pulmonary/Chest: Effort normal.  Musculoskeletal: Normal range of motion. She exhibits tenderness. She exhibits no edema.  Left elbow: no edema. Full ROM. Tenderness to lateral aspect. Increased pain with flexion and supination of Left arm.   Neurological: She is alert and oriented to person, place, and time.  Skin: Skin is warm and dry. Capillary refill takes less than 2 seconds. No rash noted. No erythema.  Left elbow: skin in tact. No ecchymosis or erythema.  Psychiatric: She has a normal mood and affect. Her behavior is normal.  Nursing note and vitals reviewed.    UC Treatments / Results  Labs (all labs ordered are listed, but only abnormal results are displayed) Labs Reviewed - No data to display  EKG  EKG Interpretation None       Radiology No results found.  Procedures Procedures (including critical care time)  Medications Ordered in UC Medications - No data to display   Initial Impression / Assessment and Plan / UC Course  I have reviewed the triage vital signs and the nursing notes.  Pertinent labs & imaging results  that were available during my care of the patient were reviewed by me and considered in my medical decision making (see chart for details).     Hx and exam c/w lateral epicondylitis w/o evidence of septic joint.   Final Clinical Impressions(s) / UC Diagnoses   Final diagnoses:  Left lateral epicondylitis   Encouraged conservative treatment. Tennis elbow strap applied for comfort. Encouraged to use cool compresses Home care instructions with home exercises provided. F/u with Sports Medicine or previously established orthopedist for further evaluation and treatment if not improving in 1 week. Pt has f/[facility] Right hip pain on Thursday, 12/26/16.  New Prescriptions Discharge Medication List as of 12/22/2016  6:25 PM    START taking these medications   Details  meloxicam (MOBIC) 15 MG tablet Take 1 tablet (15 mg total) by mouth daily. For at least 5 days, then daily as needed for pain, Starting Sun 12/22/2016, Print         Controlled Substance Prescriptions [place] Controlled Substance Registry consulted? Not Applicable   Rolla Platehelps, Aliviana Burdell O, PA-C 12/25/16 [id number]

## 2019-06-09 ENCOUNTER — Other Ambulatory Visit: Payer: Self-pay

## 2019-06-09 ENCOUNTER — Emergency Department (INDEPENDENT_AMBULATORY_CARE_PROVIDER_SITE_OTHER)
Admission: EM | Admit: 2019-06-09 | Discharge: 2019-06-09 | Disposition: A | Discharge status: Home / Self Care | Payer: PRIVATE HEALTH INSURANCE | Source: Home / Self Care

## 2019-06-09 ENCOUNTER — Emergency Department (INDEPENDENT_AMBULATORY_CARE_PROVIDER_SITE_OTHER): Payer: PRIVATE HEALTH INSURANCE

## 2019-06-09 DIAGNOSIS — R0602 Shortness of breath: Secondary | ICD-10-CM

## 2019-06-09 NOTE — ED Triage Notes (Addendum)
Pt c/o getting more and more easily winded since returning from a trip down to [facility] [facility] in mid Dec. Also c/o LT sided jaw and neck pain/tenderness, as well as tenderness in sternum that radiates all around to back. Mentions a burning sensation in her stomach as well. Denies nausea and vomiting. Has PCP f/u 2/2. Pt is hypertensive today and takes meds for it.

## 2019-06-09 NOTE — Discharge Instructions (Addendum)
See your Physician for recheck next week.  Your EKG and chest xray are normal today.  If symptoms worsen or change go to the Emergency department for evaluation

## 2019-06-10 NOTE — ED Provider Notes (Signed)
[facility]    CSN: [id number] Arrival date & time: 06/09/19  1349      History   Chief Complaint Chief Complaint  Patient presents with  . Shortness of Breath  . Jaw Pain  . Abdominal Pain    HPI Monique Wallace is a 44 y.o. female.   The history is provided by the patient. No language interpreter was used.  Shortness of Breath Severity:  Moderate Onset quality:  Sudden Timing:  Constant Progression:  Worsening Chronicity:  New Relieved by:  Nothing Worsened by:  Nothing Associated symptoms: abdominal pain   Risk factors: no prolonged immobilization   Abdominal Pain Associated symptoms: shortness of breath   Pt complains of shortness of breath for several months.  Pt reports she has soreness in her mid chest.  Pt reports she aches over her sternum. Pt complains of a soreness in the left side of her neck.  Pt reports neck is sore to touch and painful to move. Pt reports she is scheduled to see her MD on 2/2.  Pt has a histroy of diabetes, and hypertension.   Pt reports she has a sick child and does not take care of herself.   Past Medical History:  Diagnosis Date  . Abnormal Pap smear 1990s   RPT DONE WAS NORMAL;LAST PAP 02/2010  . Abnormal Pap smear 2003   HPV ON PAP  . Anxiety 2005;2010   TAKEN KLONIPIN IN PAST  . Complication of anesthesia 02/2004   INCREASED N/V AFTER C/S  . Depression 2005   HAS TAKEN CYMBALTA IN PAST;STARTED AS PP DEPRESSION  . Depression   . Diabetes mellitus    TYPE II;CURRENTLY TAKING METFORMIN 100MG  DAILY  . Gallstones 2005  . Gestational diabetes   . Hypertension    CURRENTLY TAKING LABETOLOL 100MG  BID  . Infection    yeast inf;not freq  . Infection    UTI X 1  . Obesity   . PONV (postoperative nausea and vomiting)     Patient Active Problem List   Diagnosis Date Noted  . Dehiscence of cesarean section wound, postpartum 05/16/2012  . 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  . Status post repeat low transverse  cesarean section 05/08/2012  . Allergy to penicillin 11/28/2011  . Allergy to phenergan 11/28/2011  . BMI 50.0-59.9, adult (HCC) 11/28/2011  . Hypertension 10/10/2011  . Diabetes mellitus (HCC) 10/10/2011    Past Surgical History:  Procedure Laterality Date  . CESAREAN SECTION  2005   GDM  . CESAREAN SECTION  05/08/2012   Procedure: CESAREAN SECTION;  Surgeon: 2006, MD;  Location: [facility];  Service: Obstetrics;  Laterality: N/A;  Repeat Cesarean Section Delivery Baby Boy @ [phone],   . CHOLECYSTECTOMY  2006  . NO PAST SURGERIES    . SHOULDER SURGERY  2011;2012   LT SHOULDER X 2  . WISDOM TOOTH EXTRACTION     ALL 4 EXTRACTED    OB History    Gravida  3   Para  2   Term  2   Preterm      AB  1   Living  2     SAB  1   TAB      Ectopic      Multiple      Live Births  2            Home Medications    Prior to Admission medications   Medication Sig Start Date End Date Taking? Authorizing  Provider  ezetimibe (ZETIA) 10 MG tablet Take 1 tablet by mouth daily. 05/11/19  Yes [provider]  glipiZIDE (GLUCOTROL) 10 MG tablet Take 1 tablet by mouth twice daily before meals. 05/11/19  Yes [provider]  hydrochlorothiazide (HYDRODIURIL) 12.5 MG tablet Take 1 tablet by mouth once daily.**Please schedule an office visit for further refills.** 05/11/19  Yes [provider]  metFORMIN (GLUCOPHAGE) 1000 MG tablet  09/30/11  Yes [provider]  simvastatin (ZOCOR) 20 MG tablet TAKE 1 TABLET BY MOUTH AT BEDTIME 01/06/19  Yes [provider]  telmisartan (MICARDIS) 40 MG tablet Take by mouth. 04/12/19 04/11/20 Yes [provider]  clonazePAM (KLONOPIN) 1 MG tablet Take 1 mg by mouth 3 (three) times daily as needed. 05/25/19   [provider]  escitalopram (LEXAPRO) 20 MG tablet Take 20 mg by mouth daily. 05/25/19   [provider]  ibuprofen (ADVIL,MOTRIN) 600 MG tablet Take 1 tablet (600 mg  total) by mouth every 6 (six) hours as needed for pain. 06/30/12   Nigel Bridgeman, CNM  metoprolol succinate (TOPROL-XL) 100 MG 24 hr tablet Take 100 mg by mouth daily. 06/05/19   [provider]  norethindrone (ORTHO MICRONOR) 0.35 MG tablet Take 1 tablet (0.35 mg total) by mouth daily. Start in 4 weeks take the same time each pm, if 3 hours out of that range could become pregnant if intercouse the next 48 hours 05/12/12   Lavera Guise, CNM  norethindrone (ORTHO MICRONOR) 0.35 MG tablet Take 1 tablet (0.35 mg total) by mouth daily. Start in 4 weeks take the same time each pm, if 3 hours out of that range could become pregnant if intercouse the next 48 hours 05/12/12   Lavera Guise, CNM  nystatin-triamcinolone ointment [facility]) Apply topically 3 (three) times daily as needed. 05/28/12   Silverio Lay, MD  PRENATAL VIT-FEPOLY-FA-DHA PO Take 1 capsule by mouth daily.    [provider]  sertraline (ZOLOFT) 25 MG tablet Take 2 tablets (50 mg total) by mouth daily. 06/30/12   Nigel Bridgeman, CNM    Family History Family History  Problem Relation Age of Onset  . Hypertension Mother   . Physical abuse Mother        SEXUAL  . Hyperlipidemia Maternal Grandmother   . Cancer Maternal Grandmother        BONE  . Physical abuse Maternal Grandmother        DOMESTIC  . Hyperlipidemia Maternal Grandfather   . Thyroid disease Brother        HYPERTHYROID    Social History Social History   Tobacco Use  . Smoking status: Former Smoker    Types: Cigarettes    Quit date: 08/31/2002    Years since quitting: 16.7  . Smokeless tobacco: Never Used  Substance Use Topics  . Alcohol use: No    Comment: CURRENTLY ONLY DRANK 1-2 TIMES/YEAR;IN 20'S DRANK HEAVILY X 1 YEAR  . Drug use: No     Allergies   Lactose intolerance (gi), Penicillins, Phenergan [promethazine hcl], and Tramadol   Review of Systems Review of Systems  Respiratory: Positive for shortness of breath.     Gastrointestinal: Positive for abdominal pain.  All other systems reviewed and are negative.    Physical Exam Triage Vital Signs ED Triage Vitals  Enc Vitals Group     BP 06/09/19 1423 (!) 158/116     Pulse Rate 06/09/19 1423 88     Resp 06/09/19 1423 18  Temp 06/09/19 1423 98.5 F (36.9 C)     Temp Source 06/09/19 1423 Oral     SpO2 06/09/19 1423 99 %     Weight 06/09/19 1425 246 lb (111.6 kg)     Height --      Head Circumference --      Peak Flow --      Pain Score 06/09/19 1424 2     Pain Loc --      Pain Edu? --      Excl. in [place]? --    No data found.  Updated Vital Signs BP (!) 158/116 (BP Location: Right Arm)   Pulse 88   Temp 98.5 F (36.9 C) (Oral)   Resp 18   Wt 111.6 kg   SpO2 99%   BMI 44.99 kg/m   Visual Acuity Right Eye Distance:   Left Eye Distance:   Bilateral Distance:    Right Eye Near:   Left Eye Near:    Bilateral Near:     Physical Exam Vitals and nursing note reviewed.  Constitutional:      Appearance: She is well-developed.  HENT:     Head: Normocephalic.  Cardiovascular:     Rate and Rhythm: Normal rate and regular rhythm.  Pulmonary:     Effort: Pulmonary effort is normal.     Breath sounds: No decreased breath sounds.  Chest:     Chest wall: Tenderness present.  Abdominal:     General: There is no distension.  Musculoskeletal:        General: Normal range of motion.     Cervical back: Normal range of motion and neck supple.  Skin:    General: Skin is warm.  Neurological:     Mental Status: She is alert and oriented to person, place, and time.      UC Treatments / Results  Labs (all labs ordered are listed, but only abnormal results are displayed) Labs Reviewed - No data to display  EKG EKG normal  Radiology DG Chest 2 View  Result Date: 06/09/2019 CLINICAL DATA:  Shortness of breath EXAM: CHEST - 2 VIEW COMPARISON:  08/31/2010 FINDINGS: The heart size and mediastinal contours are within normal limits.  Both lungs are clear. The visualized skeletal structures are unremarkable. IMPRESSION: No active cardiopulmonary disease. Electronically Signed   By: Donavan Foil M.D.   On: 06/09/2019 15:22    Procedures Procedures (including critical care time)  Medications Ordered in UC Medications - No data to display  Initial Impression / Assessment and Plan / UC Course  I have reviewed the triage vital signs and the nursing notes.  Pertinent labs & imaging results that were available during my care of the patient were reviewed by me and considered in my medical decision making (see chart for details).     MDM: ekg and chest xray normal.  Pain sound muscular.  I doubt PE or cardiac etiology.  Pt advised she needs to follow up with her MD.  Pt advised to go to Emergency department if worsening or changing symptoms  Final Clinical Impressions(s) / UC Diagnoses   Final diagnoses:  Shortness of breath     Discharge Instructions     See your Physician for recheck next week.  Your EKG and chest xray are normal today.  If symptoms worsen or change go to the Emergency department for evaluation    ED Prescriptions    None     PDMP not reviewed this encounter.  An  After Visit Summary was printed and given to the patient.    Elson Areas, [place] 06/10/19 1352

## 2019-07-24 ENCOUNTER — Emergency Department (INDEPENDENT_AMBULATORY_CARE_PROVIDER_SITE_OTHER)
Admission: EM | Admit: 2019-07-24 | Discharge: 2019-07-24 | Disposition: A | Discharge status: Home / Self Care | Payer: PRIVATE HEALTH INSURANCE | Source: Home / Self Care | Attending: Family Medicine | Admitting: Family Medicine

## 2019-07-24 ENCOUNTER — Emergency Department (INDEPENDENT_AMBULATORY_CARE_PROVIDER_SITE_OTHER): Payer: PRIVATE HEALTH INSURANCE

## 2019-07-24 ENCOUNTER — Other Ambulatory Visit: Payer: Self-pay

## 2019-07-24 DIAGNOSIS — S93401A Sprain of unspecified ligament of right ankle, initial encounter: Secondary | ICD-10-CM

## 2019-07-24 DIAGNOSIS — M25571 Pain in right ankle and joints of right foot: Secondary | ICD-10-CM

## 2019-07-24 DIAGNOSIS — S93402A Sprain of unspecified ligament of left ankle, initial encounter: Secondary | ICD-10-CM

## 2019-07-24 NOTE — Discharge Instructions (Addendum)
Apply ice pack for 30 minutes every 1 to 2 hours today and tomorrow.  Elevate.  Use crutches for about one week.  Wear Ace wraps until swelling decreases.  Wear brace on right ankle for about 3 to 4 weeks.  Begin range of motion and stretching exercises in about 5 days as per instruction sheet.  May take ibuprofen as needed.

## 2019-07-24 NOTE — ED Triage Notes (Signed)
Pt c/o RT ankle pain since yesterday when she rolled her ankle while walking in her yard. Pain 6/10. Has previously broken same ankle.

## 2019-07-24 NOTE — ED Provider Notes (Signed)
[name]    CSN: [id number] Arrival date & time: 07/24/19  1317      History   Chief Complaint Chief Complaint  Patient presents with  . Ankle Pain    RT    HPI Monique Wallace is a 44 y.o. female.   While walking yesterday, patient's right foot stepped into a hole and inverted, causing immediate pain.  She also twisted her left foot slightly, reporting minimal pain over her lateral malleolus.  The history is provided by the patient.  Ankle Pain Location:  Ankle Time since incident:  1 day Injury: yes   Mechanism of injury comment:  Stepped into hole Ankle location:  R ankle Pain details:    Quality:  Aching   Radiates to:  Does not radiate   Severity:  Moderate   Onset quality:  Sudden   Duration:  1 day   Timing:  Constant   Progression:  Unchanged Chronicity:  New Prior injury to area:  Yes Relieved by:  Nothing Worsened by:  Bearing weight Ineffective treatments:  Ice Associated symptoms: decreased ROM, stiffness and swelling   Associated symptoms: no muscle weakness, no numbness and no tingling   Risk factors: obesity     Past Medical History:  Diagnosis Date  . Abnormal Pap smear 1990s   RPT DONE WAS NORMAL;LAST PAP 02/2010  . Abnormal Pap smear 2003   HPV ON PAP  . Anxiety 2005;2010   TAKEN KLONIPIN IN PAST  . Complication of anesthesia 02/2004   INCREASED N/V AFTER C/S  . Depression 2005   HAS TAKEN CYMBALTA IN PAST;STARTED AS PP DEPRESSION  . Depression   . Diabetes mellitus    TYPE II;CURRENTLY TAKING METFORMIN 100MG  DAILY  . Gallstones 2005  . Gestational diabetes   . Hypertension    CURRENTLY TAKING LABETOLOL 100MG  BID  . Infection    yeast inf;not freq  . Infection    UTI X 1  . Obesity   . PONV (postoperative nausea and vomiting)     Patient Active Problem List   Diagnosis Date Noted  . Dehiscence of cesarean section wound, postpartum 05/16/2012  . 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  . Status post repeat low  transverse cesarean section 05/08/2012  . Allergy to penicillin 11/28/2011  . Allergy to phenergan 11/28/2011  . BMI 50.0-59.9, adult (HCC) 11/28/2011  . Hypertension 10/10/2011  . Diabetes mellitus (HCC) 10/10/2011    Past Surgical History:  Procedure Laterality Date  . CESAREAN SECTION  2005   GDM  . CESAREAN SECTION  05/08/2012   Procedure: CESAREAN SECTION;  Surgeon: 2006, MD;  Location: [facility];  Service: Obstetrics;  Laterality: N/A;  Repeat Cesarean Section Delivery Baby Boy @ [phone],   . CHOLECYSTECTOMY  2006  . NO PAST SURGERIES    . SHOULDER SURGERY  2011;2012   LT SHOULDER X 2  . WISDOM TOOTH EXTRACTION     ALL 4 EXTRACTED    OB History    Gravida  3   Para  2   Term  2   Preterm      AB  1   Living  2     SAB  1   TAB      Ectopic      Multiple      Live Births  2            Home Medications    Prior to Admission medications   Medication Sig Start Date  End Date Taking? Authorizing Provider  clonazePAM (KLONOPIN) 1 MG tablet Take 1 mg by mouth 3 (three) times daily as needed. 05/25/19   [provider]  escitalopram (LEXAPRO) 20 MG tablet Take 20 mg by mouth daily. 05/25/19   [provider]  ezetimibe (ZETIA) 10 MG tablet Take 1 tablet by mouth daily. 05/11/19   [provider]  glipiZIDE (GLUCOTROL) 10 MG tablet Take 1 tablet by mouth twice daily before meals. 05/11/19   [provider]  hydrochlorothiazide (HYDRODIURIL) 12.5 MG tablet 25 mg.  05/11/19   [provider]  ibuprofen (ADVIL,MOTRIN) 600 MG tablet Take 1 tablet (600 mg total) by mouth every 6 (six) hours as needed for pain. 06/30/12   Donnel Saxon, CNM  metFORMIN (GLUCOPHAGE) 1000 MG tablet  09/30/11   [provider]  metoprolol succinate (TOPROL-XL) 100 MG 24 hr tablet Take 200 mg by mouth daily.  06/05/19   [provider]  norethindrone (ORTHO MICRONOR) 0.35 MG tablet Take 1 tablet (0.35 mg total) by mouth  daily. Start in 4 weeks take the same time each pm, if 3 hours out of that range could become pregnant if intercouse the next 48 hours 05/12/12   Hetty Ely, CNM  norethindrone (ORTHO MICRONOR) 0.35 MG tablet Take 1 tablet (0.35 mg total) by mouth daily. Start in 4 weeks take the same time each pm, if 3 hours out of that range could become pregnant if intercouse the next 48 hours 05/12/12   Hetty Ely, CNM  nystatin-triamcinolone ointment [facility]) Apply topically 3 (three) times daily as needed. 05/28/12   Delsa Bern, MD  PRENATAL VIT-FEPOLY-FA-DHA PO Take 1 capsule by mouth daily.    [provider]  sertraline (ZOLOFT) 25 MG tablet Take 2 tablets (50 mg total) by mouth daily. 06/30/12   Donnel Saxon, CNM  simvastatin (ZOCOR) 20 MG tablet TAKE 1 TABLET BY MOUTH AT BEDTIME 01/06/19   [provider]  telmisartan (MICARDIS) 40 MG tablet Take by mouth. 04/12/19 04/11/20  [provider]    Family History Family History  Problem Relation Age of Onset  . Hypertension Mother   . Physical abuse Mother        SEXUAL  . Hyperlipidemia Maternal Grandmother   . Cancer Maternal Grandmother        BONE  . Physical abuse Maternal Grandmother        DOMESTIC  . Hyperlipidemia Maternal Grandfather   . Thyroid disease Brother        HYPERTHYROID    Social History Social History   Tobacco Use  . Smoking status: Former Smoker    Types: Cigarettes    Quit date: 08/31/2002    Years since quitting: 16.9  . Smokeless tobacco: Never Used  Substance Use Topics  . Alcohol use: No    Comment: CURRENTLY ONLY DRANK 1-2 TIMES/YEAR;IN 20'S DRANK HEAVILY X 1 YEAR  . Drug use: No     Allergies   Lactose intolerance (gi), Penicillins, Phenergan [promethazine hcl], and Tramadol   Review of Systems Review of Systems  Musculoskeletal: Positive for joint swelling and stiffness.  All other systems reviewed and are negative.    Physical Exam Triage Vital Signs ED  Triage Vitals [07/24/19 1331]  Enc Vitals Group     BP 137/87     Pulse Rate 95     Resp 18     Temp 98.9 F (37.2 C)     Temp Source Oral     SpO2  97 %     Weight 243 lb (110.2 kg)     Height 4\' 10"  (1.473 m)     Head Circumference      Peak Flow      Pain Score 5     Pain Loc      Pain Edu?      Excl. in [place]?    No data found.  Updated Vital Signs BP 137/87 (BP Location: Right Arm)   Pulse 95   Temp 98.9 F (37.2 C) (Oral)   Resp 18   Ht 4\' 10"  (1.473 m)   Wt 110.2 kg   SpO2 97%   BMI 50.79 kg/m   Visual Acuity Right Eye Distance:   Left Eye Distance:   Bilateral Distance:    Right Eye Near:   Left Eye Near:    Bilateral Near:     Physical Exam Vitals and nursing note reviewed.  Constitutional:      General: She is not in acute distress. HENT:     Head: Atraumatic.     Right Ear: External ear normal.     Left Ear: External ear normal.     Nose: Nose normal.  Eyes:     Conjunctiva/sclera: Conjunctivae normal.     Pupils: Pupils are equal, round, and reactive to light.  Cardiovascular:     Rate and Rhythm: Normal rate.  Pulmonary:     Effort: Pulmonary effort is normal.  Musculoskeletal:     Right ankle: Swelling present. No ecchymosis or lacerations. Tenderness present over the lateral malleolus. No base of 5th metatarsal or proximal fibula tenderness. Decreased range of motion. Normal pulse.     Right Achilles Tendon: Normal.     Left ankle: No swelling, ecchymosis or lacerations. Tenderness present over the lateral malleolus and base of 5th metatarsal. Normal range of motion. Normal pulse.     Left Achilles Tendon: Normal.       Feet:     Comments: Right ankle:  Decreased range of motion.  Tenderness and swelling over the lateral malleolus.  Joint stable.  No tenderness over the base of the fifth metatarsal.  Distal neurovascular function is intact.   Left ankle has normal exam except for minimal tenderness over the lateral malleolus.  Skin:     General: Skin is warm and dry.  Neurological:     Mental Status: She is alert.      UC Treatments / Results  Labs (all labs ordered are listed, but only abnormal results are displayed) Labs Reviewed - No data to display  EKG   Radiology DG Ankle Complete Right  Result Date: 07/24/2019 CLINICAL DATA:  Right ankle pain after injury yesterday EXAM: RIGHT ANKLE - COMPLETE 3+ VIEW COMPARISON:  None. FINDINGS: No fracture or subluxation. Small Achilles right calcaneal spur. No focal osseous lesions. No radiopaque foreign bodies. Mild lateral right ankle soft tissue swelling. IMPRESSION: No fracture or subluxation. Electronically Signed   By: M.D.   On: 07/24/2019 14:39    Procedures Procedures (including critical care time)  Medications Ordered in UC Medications - No data to display  Initial Impression / Assessment and Plan / UC Course  I have reviewed the triage vital signs and the nursing notes.  Pertinent labs & imaging results that were available during my care of the patient were reviewed by me and considered in my medical decision making (see chart for details).    Left ankle has minimal sprain: ace wrap applied. Right  ankle:  Ace wrap and AirCast stirrup splint applied.  Dispensed crutches. Followup with Dr. [facility] if not improving about two weeks.    Final Clinical Impressions(s) / UC Diagnoses   Final diagnoses:  Sprain of right ankle, unspecified ligament, initial encounter  Sprain of left ankle, unspecified ligament, initial encounter     Discharge Instructions     Apply ice pack for 30 minutes every 1 to 2 hours today and tomorrow.  Elevate.  Use crutches for about one week.  Wear Ace wraps until swelling decreases.  Wear brace on right ankle for about 3 to 4 weeks.  Begin range of motion and stretching exercises in about 5 days as per instruction sheet.  May take ibuprofen as needed.     ED Prescriptions     None        Lattie Haw, MD 07/24/19 1551

## 2020-02-22 ENCOUNTER — Ambulatory Visit: Payer: Self-pay

## 2020-03-20 ENCOUNTER — Other Ambulatory Visit: Payer: Self-pay

## 2020-03-20 ENCOUNTER — Ambulatory Visit: Payer: Self-pay

## 2020-03-20 ENCOUNTER — Emergency Department (INDEPENDENT_AMBULATORY_CARE_PROVIDER_SITE_OTHER)
Admission: EM | Admit: 2020-03-20 | Discharge: 2020-03-20 | Disposition: A | Discharge status: Home / Self Care | Payer: PRIVATE HEALTH INSURANCE | Source: Home / Self Care

## 2020-03-20 DIAGNOSIS — E860 Dehydration: Secondary | ICD-10-CM

## 2020-03-20 DIAGNOSIS — R300 Dysuria: Secondary | ICD-10-CM

## 2020-03-20 DIAGNOSIS — R824 Acetonuria: Secondary | ICD-10-CM

## 2020-03-20 DIAGNOSIS — E1110 Type 2 diabetes mellitus with ketoacidosis without coma: Secondary | ICD-10-CM

## 2020-03-20 DIAGNOSIS — R81 Glycosuria: Secondary | ICD-10-CM

## 2020-03-20 LAB — POCT URINALYSIS DIP (MANUAL ENTRY)
Glucose, UA: >=1000 mg/dL — AB
Leukocytes, UA: NEGATIVE
Nitrite, UA: NEGATIVE
Protein Ur, POC: >=300 mg/dL — AB
Spec Grav, UA: >=1.03 — AB (ref 1.010–1.025)
Urobilinogen, UA: 0.2 E.U./dL
pH, UA: 5.5 (ref 5.0–8.0)

## 2020-03-20 LAB — POCT FASTING CBG KUC MANUAL ENTRY: POCT Glucose (KUC): 294 mg/dL — AB (ref 70–99)

## 2020-03-20 MED ORDER — ONDANSETRON 4 MG PO TBDP
4.0000 mg | ORAL_TABLET | Freq: Once | ORAL | Status: AC
  Administered 2020-03-20: 4 mg via ORAL

## 2020-03-20 MED ORDER — KETOROLAC TROMETHAMINE 60 MG/2ML IM SOLN
60.0000 mg | Freq: Once | INTRAMUSCULAR | Status: AC
  Administered 2020-03-20: 60 mg via INTRAMUSCULAR

## 2020-03-20 NOTE — ED Provider Notes (Signed)
[name]    CSN: [id number] Arrival date & time: 03/20/20  1745      History   Chief Complaint Chief Complaint  Patient presents with  . Appointment    6:00  . Nausea    HPI Monique Wallace is a 44 y.o. female.   HPI  Patient presents today with over 2 days of nausea, vomiting, bilateral abdominal pain, generalized achiness, fatigue, intolerance of food, increased urination. She has type II uncontrolled diabetes with most recent A1c of 11.  Patient is prescribed glipizide and Victoza and reports that she has not been taking her Victoza over the last few months.  She has not checked her blood sugar since the symptoms develop.  She also endorses left flank pain which is sharp and has been occurring intermittently since yesterday.  She reports a history of nephrolithiasis.  She also felt feverish.  She has had no other URI symptoms.  She has been unable to take any of her medications due to the severity of the nausea over the last couple days. Past Medical History:  Diagnosis Date  . Abnormal Pap smear 1990s   RPT DONE WAS NORMAL;LAST PAP 02/2010  . Abnormal Pap smear 2003   HPV ON PAP  . Anxiety 2005;2010   TAKEN KLONIPIN IN PAST  . Complication of anesthesia 02/2004   INCREASED N/V AFTER C/S  . Depression 2005   HAS TAKEN CYMBALTA IN PAST;STARTED AS PP DEPRESSION  . Depression   . Diabetes mellitus    TYPE II;CURRENTLY TAKING METFORMIN 100MG  DAILY  . Gallstones 2005  . Gestational diabetes   . Hypertension    CURRENTLY TAKING LABETOLOL 100MG  BID  . Infection    yeast inf;not freq  . Infection    UTI X 1  . Obesity   . PONV (postoperative nausea and vomiting)     Patient Active Problem List   Diagnosis Date Noted  . Dehiscence of cesarean section wound, postpartum 05/16/2012  . 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  . Status post repeat low transverse cesarean section 05/08/2012  . Allergy to penicillin 11/28/2011  . Allergy to phenergan 11/28/2011   . BMI 50.0-59.9, adult (HCC) 11/28/2011  . Hypertension 10/10/2011  . Diabetes mellitus (HCC) 10/10/2011    Past Surgical History:  Procedure Laterality Date  . CESAREAN SECTION  2005   GDM  . CESAREAN SECTION  05/08/2012   Procedure: CESAREAN SECTION;  Surgeon: 2006, MD;  Location: [facility];  Service: Obstetrics;  Laterality: N/A;  Repeat Cesarean Section Delivery Baby Boy @ [phone],   . CHOLECYSTECTOMY  2006  . NO PAST SURGERIES    . SHOULDER SURGERY  2011;2012   LT SHOULDER X 2  . WISDOM TOOTH EXTRACTION     ALL 4 EXTRACTED    OB History    Gravida  3   Para  2   Term  2   Preterm      AB  1   Living  2     SAB  1   TAB      Ectopic      Multiple      Live Births  2            Home Medications    Prior to Admission medications   Medication Sig Start Date End Date Taking? Authorizing Provider  amLODipine (NORVASC) 5 MG tablet Take 5 mg by mouth daily.   Yes [provider]  clonazePAM (KLONOPIN) 1 MG tablet  Take 1 mg by mouth 3 (three) times daily as needed. 05/25/19   [provider]  escitalopram (LEXAPRO) 20 MG tablet Take 20 mg by mouth daily. 05/25/19   [provider]  ezetimibe (ZETIA) 10 MG tablet Take 1 tablet by mouth daily. 05/11/19   [provider]  glipiZIDE (GLUCOTROL) 10 MG tablet Take 1 tablet by mouth twice daily before meals. 05/11/19   [provider]  hydrochlorothiazide (HYDRODIURIL) 12.5 MG tablet 25 mg.  05/11/19   [provider]  ibuprofen (ADVIL,MOTRIN) 600 MG tablet Take 1 tablet (600 mg total) by mouth every 6 (six) hours as needed for pain. 06/30/12   Nigel Bridgeman, CNM  metFORMIN (GLUCOPHAGE) 1000 MG tablet  09/30/11   [provider]  metoprolol succinate (TOPROL-XL) 100 MG 24 hr tablet Take 200 mg by mouth daily.  06/05/19   [provider]  norethindrone (ORTHO MICRONOR) 0.35 MG tablet Take 1 tablet (0.35 mg total) by mouth daily. Start in 4  weeks take the same time each pm, if 3 hours out of that range could become pregnant if intercouse the next 48 hours 05/12/12   Lavera Guise, CNM  norethindrone (ORTHO MICRONOR) 0.35 MG tablet Take 1 tablet (0.35 mg total) by mouth daily. Start in 4 weeks take the same time each pm, if 3 hours out of that range could become pregnant if intercouse the next 48 hours 05/12/12   Lavera Guise, CNM  nystatin-triamcinolone ointment [facility]) Apply topically 3 (three) times daily as needed. 05/28/12   Silverio Lay, MD  PRENATAL VIT-FEPOLY-FA-DHA PO Take 1 capsule by mouth daily.    [provider]  sertraline (ZOLOFT) 25 MG tablet Take 2 tablets (50 mg total) by mouth daily. 06/30/12   Nigel Bridgeman, CNM  simvastatin (ZOCOR) 20 MG tablet TAKE 1 TABLET BY MOUTH AT BEDTIME 01/06/19   [provider]  telmisartan (MICARDIS) 40 MG tablet Take by mouth. 04/12/19 04/11/20  [provider]    Family History Family History  Problem Relation Age of Onset  . Hypertension Mother   . Physical abuse Mother        SEXUAL  . Hyperlipidemia Maternal Grandmother   . Cancer Maternal Grandmother        BONE  . Physical abuse Maternal Grandmother        DOMESTIC  . Hyperlipidemia Maternal Grandfather   . Thyroid disease Brother        HYPERTHYROID    Social History Social History   Tobacco Use  . Smoking status: Former Smoker    Types: Cigarettes    Quit date: 08/31/2002    Years since quitting: 17.5  . Smokeless tobacco: Never Used  Vaping Use  . Vaping Use: Never used  Substance Use Topics  . Alcohol use: No    Comment: CURRENTLY ONLY DRANK 1-2 TIMES/YEAR;IN 20'S DRANK HEAVILY X 1 YEAR  . Drug use: No     Allergies   Lactose intolerance (gi), Penicillins, Phenergan [promethazine hcl], and Tramadol   Review of Systems Review of Systems Pertinent negatives listed in HPI  Physical Exam Triage Vital Signs ED Triage Vitals  Enc Vitals Group     BP 03/20/20 1800  (!) 145/97     Pulse Rate 03/20/20 1800 (!) 111     Resp 03/20/20 1800 16     Temp 03/20/20 1800 98.6 F (37 C)     Temp Source 03/20/20 1800 Oral     SpO2 03/20/20 1800 98 %  Weight --      Height --      Head Circumference --      Peak Flow --      Pain Score 03/20/20 1756 8     Pain Loc --      Pain Edu? --      Excl. in GC? --    No data found.  Updated Vital Signs BP (!) 145/97 (BP Location: Left Arm) Comment: has not taken bp meds today  Pulse (!) 111   Temp 98.6 F (37 C) (Oral)   Resp 16   SpO2 98%   Breastfeeding No   Visual Acuity Right Eye Distance:   Left Eye Distance:   Bilateral Distance:    Right Eye Near:   Left Eye Near:    Bilateral Near:     Physical Exam General appearance: alert, obese, appears ill Head: Normocephalic, without obvious abnormality, atraumatic Respiratory: Respirations even and unlabored, normal respiratory rate Heart: rate and rhythm normal. No gallop or murmurs noted on exam  Abdomen: BS +, no distention, no rebound tenderness, or no mass, left flank pain present Extremities: No gross deformities Skin: Skin color, texture, turgor normal. No rashes seen  Psych: Appropriate mood and affect. UC Treatments / Results  Labs (all labs ordered are listed, but only abnormal results are displayed) Labs Reviewed  POCT URINALYSIS DIP (MANUAL ENTRY) - Abnormal; Notable for the following components:      Result Value   Color, UA other (*)    Glucose, UA >=1,000 (*)    Bilirubin, UA moderate (*)    Ketones, POC UA large (80) (*)    Spec Grav, UA >=1.030 (*)    Blood, UA trace-intact (*)    Protein Ur, POC >=300 (*)    All other components within normal limits  POCT FASTING CBG [facility] MANUAL ENTRY - Abnormal; Notable for the following components:   POCT Glucose ([facility]) 294 (*)    All other components within normal limits  URINE CULTURE    EKG   Radiology No results found.  Procedures Procedures (including critical care  time)  Medications Ordered in UC Medications  ondansetron (ZOFRAN-ODT) disintegrating tablet 4 mg (4 mg Oral Given 03/20/20 1842)  ketorolac (TORADOL) injection 60 mg (60 mg Intramuscular Given 03/20/20 1842)    Initial Impression / Assessment and Plan / UC Course  I have reviewed the triage vital signs and the nursing notes.  Pertinent labs & imaging results that were available during my care of the patient were reviewed by me and considered in my medical decision making (see chart for details).     Patient is being referred to [facility] for further work-up and evaluation of possible DKA and possible renal stone.  Patient has gross glucose, gross hematuria and an abundance of ketones present in her urine.  She has been fasting due to the nausea over the last 2 days and her blood sugar fasting at present is 245.  She has not been checking her glucose over the last couple days.  Patient was given Zofran as she remains nauseous while here in clinic along with Toradol for the left flank pain which I suspect may be related to a renal stone.  Patient is afebrile and stable and agrees to go immediately to the ER for further work-up and evaluation. Final Clinical Impressions(s) / UC Diagnoses   Final diagnoses:  Dysuria  Dehydration  Diabetic ketoacidosis without coma associated with type 2 diabetes  mellitus (HCC)  Urine ketone  Glucose found in urine on examination     Discharge Instructions     [facility] for further work-up and evaluation of diabetic ketoacidosis. Abnormal findings that warrant further work-up: Glucose here at urgent care is 245 greater than 80 ketones in urine, greater than 300 protein in urine, SPG>1.030 Most recent A1c 11 from 10/2019  Left flank pain concerning for possible kidney stone.  You were given IM Toradol 60 for pain and oral Zofran mg , ODT for  nausea.     ED  Prescriptions    None     PDMP not reviewed this encounter.   Bing NeighborsHarris, Encarnacion Bole S, FNP 03/20/20 1942

## 2020-03-20 NOTE — ED Triage Notes (Signed)
Patient presents to Urgent Care with complaints of nausea, diarrhea, and left posterior rib pain (intermittent and stabbing) since yesterday. Patient reports she then developed chills today. Has a colonoscopy and endoscopy the day after tomorrow, is not sure if her sx will impact her ability to have the tests done.  Is diabetic, states her urine is darker than normal.

## 2020-03-20 NOTE — ED Notes (Signed)
Patient is being discharged from the Urgent Care and sent to the Emergency Department via POV . Per Selena Batten, NP, patient is in need of higher level of care due to symptomatic possible DKA. Patient is aware and verbalizes understanding of plan of care.  Vitals:   03/20/20 1800  BP: (!) 145/97  Pulse: (!) 111  Resp: 16  Temp: 98.6 F (37 C)  SpO2: 98%

## 2020-03-20 NOTE — Discharge Instructions (Signed)
You are being referred to [facility] Emergency department for further work-up and evaluation of diabetic ketoacidosis. Abnormal findings that warrant further work-up: Glucose here at urgent care is 245 greater than 80 ketones in urine, greater than 300 protein in urine, SPG>1.030 Most recent A1c 11 from 10/2019  Left flank pain concerning for possible kidney stone.  You were given IM Toradol 60 for pain and oral Zofran mg , ODT for  nausea.

## 2020-03-22 LAB — URINE CULTURE
MICRO NUMBER:: 11176342
Result:: NO GROWTH
SPECIMEN QUALITY:: ADEQUATE

## 2020-05-08 ENCOUNTER — Ambulatory Visit: Payer: Self-pay

## 2020-05-08 ENCOUNTER — Emergency Department (INDEPENDENT_AMBULATORY_CARE_PROVIDER_SITE_OTHER)
Admission: EM | Admit: 2020-05-08 | Discharge: 2020-05-08 | Disposition: A | Discharge status: Home / Self Care | Payer: PRIVATE HEALTH INSURANCE | Source: Home / Self Care

## 2020-05-08 DIAGNOSIS — U071 COVID-19: Secondary | ICD-10-CM | POA: Diagnosis not present

## 2020-05-08 LAB — POC SARS CORONAVIRUS 2 AG -  ED: SARS Coronavirus 2 Ag: POSITIVE — AB

## 2020-05-08 MED ORDER — ONDANSETRON HCL 4 MG/2ML IJ SOLN
4.0000 mg | Freq: Once | INTRAMUSCULAR | Status: AC
  Administered 2020-05-08: 4 mg via INTRAMUSCULAR

## 2020-05-08 MED ORDER — ONDANSETRON 4 MG PO TBDP: 4.0000 mg | ORAL_TABLET | Freq: Three times a day (TID) | ORAL | 0 refills | Status: AC | PRN

## 2020-05-08 NOTE — ED Triage Notes (Signed)
Patient presents to Urgent Care with complaints of cough, headache, and vomiting since 5 days ago. Patient reports she went to a child's party and found out a few days later that one of the people there had covid. Pt lost her sense of smell as well, vomiting upon arrival.

## 2020-05-08 NOTE — ED Provider Notes (Signed)
[facility]    CSN: [id number] Arrival date & time: 05/08/20  1906      History   Chief Complaint Chief Complaint  Patient presents with  . Emesis    HPI Monique Wallace is a 44 y.o. female.   Patient complains of cough headache and vomiting.  Reports she went to the child's party recently found out it was people there who had Covid.  Has lost her sense of smell.  Was vomiting on arrival here.  HPI  Past Medical History:  Diagnosis Date  . Abnormal Pap smear 1990s   RPT DONE WAS NORMAL;LAST PAP 02/2010  . Abnormal Pap smear 2003   HPV ON PAP  . Anxiety 2005;2010   TAKEN KLONIPIN IN PAST  . Complication of anesthesia 02/2004   INCREASED N/V AFTER C/S  . Depression 2005   HAS TAKEN CYMBALTA IN PAST;STARTED AS PP DEPRESSION  . Depression   . Diabetes mellitus    TYPE II;CURRENTLY TAKING METFORMIN 100MG  DAILY  . Gallstones 2005  . Gestational diabetes   . Hypertension    CURRENTLY TAKING LABETOLOL 100MG  BID  . Infection    yeast inf;not freq  . Infection    UTI X 1  . Obesity   . PONV (postoperative nausea and vomiting)     Patient Active Problem List   Diagnosis Date Noted  . Dehiscence of cesarean section wound, postpartum 05/16/2012  . 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  . Status post repeat low transverse cesarean section 05/08/2012  . Allergy to penicillin 11/28/2011  . Allergy to phenergan 11/28/2011  . BMI 50.0-59.9, adult (HCC) 11/28/2011  . Hypertension 10/10/2011  . Diabetes mellitus (HCC) 10/10/2011    Past Surgical History:  Procedure Laterality Date  . CESAREAN SECTION  2005   GDM  . CESAREAN SECTION  05/08/2012   Procedure: CESAREAN SECTION;  Surgeon: 2006, MD;  Location: [facility];  Service: Obstetrics;  Laterality: N/A;  Repeat Cesarean Section Delivery Baby Boy @ [phone],   . CHOLECYSTECTOMY  2006  . NO PAST SURGERIES    . SHOULDER SURGERY  2011;2012   LT SHOULDER X 2  . WISDOM TOOTH EXTRACTION     ALL 4 EXTRACTED     OB History    Gravida  3   Para  2   Term  2   Preterm      AB  1   Living  2     SAB  1   IAB      Ectopic      Multiple      Live Births  2            Home Medications    Prior to Admission medications   Medication Sig Start Date End Date Taking? Authorizing Provider  amLODipine (NORVASC) 5 MG tablet Take 5 mg by mouth daily.    [provider]  clonazePAM (KLONOPIN) 1 MG tablet Take 1 mg by mouth 3 (three) times daily as needed. 05/25/19   [provider]  escitalopram (LEXAPRO) 20 MG tablet Take 20 mg by mouth daily. 05/25/19   [provider]  ezetimibe (ZETIA) 10 MG tablet Take 1 tablet by mouth daily. 05/11/19   [provider]  glipiZIDE (GLUCOTROL) 10 MG tablet Take 1 tablet by mouth twice daily before meals. 05/11/19   [provider]  hydrochlorothiazide (HYDRODIURIL) 12.5 MG tablet 25 mg.  05/11/19   [provider]  ibuprofen (ADVIL,MOTRIN) 600 MG tablet  Take 1 tablet (600 mg total) by mouth every 6 (six) hours as needed for pain. 06/30/12   Nigel Bridgeman, CNM  metFORMIN (GLUCOPHAGE) 1000 MG tablet  09/30/11   [provider]  metoprolol succinate (TOPROL-XL) 100 MG 24 hr tablet Take 200 mg by mouth daily.  06/05/19   [provider]  norethindrone (ORTHO MICRONOR) 0.35 MG tablet Take 1 tablet (0.35 mg total) by mouth daily. Start in 4 weeks take the same time each pm, if 3 hours out of that range could become pregnant if intercouse the next 48 hours 05/12/12   Lavera Guise, CNM  norethindrone (ORTHO MICRONOR) 0.35 MG tablet Take 1 tablet (0.35 mg total) by mouth daily. Start in 4 weeks take the same time each pm, if 3 hours out of that range could become pregnant if intercouse the next 48 hours 05/12/12   Lavera Guise, CNM  nystatin-triamcinolone ointment [facility]) Apply topically 3 (three) times daily as needed. 05/28/12   Silverio Lay, MD  PRENATAL VIT-FEPOLY-FA-DHA PO Take 1  capsule by mouth daily.    [provider]  sertraline (ZOLOFT) 25 MG tablet Take 2 tablets (50 mg total) by mouth daily. 06/30/12   Nigel Bridgeman, CNM  simvastatin (ZOCOR) 20 MG tablet TAKE 1 TABLET BY MOUTH AT BEDTIME 01/06/19   [provider]  telmisartan (MICARDIS) 40 MG tablet Take by mouth. 04/12/19 04/11/20  [provider]    Family History Family History  Problem Relation Age of Onset  . Hypertension Mother   . Physical abuse Mother        SEXUAL  . Hyperlipidemia Maternal Grandmother   . Cancer Maternal Grandmother        BONE  . Physical abuse Maternal Grandmother        DOMESTIC  . Hyperlipidemia Maternal Grandfather   . Thyroid disease Brother        HYPERTHYROID    Social History Social History   Tobacco Use  . Smoking status: Former Smoker    Types: Cigarettes    Quit date: 08/31/2002    Years since quitting: 17.6  . Smokeless tobacco: Never Used  Vaping Use  . Vaping Use: Never used  Substance Use Topics  . Alcohol use: No    Comment: CURRENTLY ONLY DRANK 1-2 TIMES/YEAR;IN 20'S DRANK HEAVILY X 1 YEAR  . Drug use: No     Allergies   Lactose intolerance (gi), Penicillins, Phenergan [promethazine hcl], and Tramadol   Review of Systems Review of Systems  Constitutional: Positive for fatigue.  HENT: Positive for congestion.   Gastrointestinal: Positive for vomiting.  All other systems reviewed and are negative.    Physical Exam Triage Vital Signs ED Triage Vitals  Enc Vitals Group     BP 05/08/20 1914 132/89     Pulse Rate 05/08/20 1914 (!) 118     Resp 05/08/20 1914 16     Temp 05/08/20 1914 99.1 F (37.3 C)     Temp Source 05/08/20 1914 Oral     SpO2 05/08/20 1914 97 %     Weight --      Height --      Head Circumference --      Peak Flow --      Pain Score 05/08/20 1912 0     Pain Loc --      Pain Edu? --      Excl. in GC? --    No data found.  Updated Vital Signs BP 132/89 (BP Location:  Right Arm)    Pulse (!) 118   Temp 99.1 F (37.3 C) (Oral)   Resp 16   SpO2 97%   Visual Acuity Right Eye Distance:   Left Eye Distance:   Bilateral Distance:    Right Eye Near:   Left Eye Near:    Bilateral Near:     Physical Exam Vitals and nursing note reviewed.  Constitutional:      Appearance: Normal appearance.  HENT:     Nose: Nose normal.     Mouth/Throat:     Mouth: Mucous membranes are moist.     Pharynx: Oropharynx is clear.  Cardiovascular:     Rate and Rhythm: Regular rhythm. Tachycardia present.  Pulmonary:     Effort: Pulmonary effort is normal.     Breath sounds: Normal breath sounds.  Abdominal:     Palpations: Abdomen is soft.  Neurological:     General: No focal deficit present.     Mental Status: She is alert and oriented to person, place, and time.      UC Treatments / Results  Labs (all labs ordered are listed, but only abnormal results are displayed) Labs Reviewed  POC SARS CORONAVIRUS 2 AG -  ED - Abnormal; Notable for the following components:      Result Value   SARS Coronavirus 2 Ag Positive (*)    All other components within normal limits    EKG   Radiology No results found.  Procedures Procedures (including critical care time)  Medications Ordered in UC Medications  ondansetron (ZOFRAN) injection 4 mg (4 mg Intramuscular Given 05/08/20 1917)    Initial Impression / Assessment and Plan / UC Course  I have reviewed the triage vital signs and the nursing notes.  Pertinent labs & imaging results that were available during my care of the patient were reviewed by me and considered in my medical decision making (see chart for details).     Covid with nausea and vomiting Final Clinical Impressions(s) / UC Diagnoses   Final diagnoses:  None   Discharge Instructions   None    ED Prescriptions    None     PDMP not reviewed this encounter.   Frederica Kuster, MD 05/08/20 1949

## 2020-05-13 ENCOUNTER — Ambulatory Visit: Payer: Self-pay

## 2020-12-10 ENCOUNTER — Emergency Department: Admit: 2020-12-10 | Payer: Self-pay

## 2020-12-11 ENCOUNTER — Other Ambulatory Visit: Payer: Self-pay

## 2020-12-11 ENCOUNTER — Emergency Department
Admission: RE | Admit: 2020-12-11 | Discharge: 2020-12-11 | Disposition: A | Discharge status: Home / Self Care | Payer: PRIVATE HEALTH INSURANCE | Source: Ambulatory Visit | Attending: Family Medicine | Admitting: Family Medicine

## 2020-12-11 VITALS — BP 149/88 | HR 87 | Temp 99.1°F | Resp 14 | Ht <= 58 in | Wt 227.0 lb

## 2020-12-11 DIAGNOSIS — J069 Acute upper respiratory infection, unspecified: Secondary | ICD-10-CM | POA: Diagnosis not present

## 2020-12-11 HISTORY — DX: Allergy to other foods: Z91.018

## 2020-12-11 NOTE — ED Provider Notes (Signed)
[name]    CSN: [id number] Arrival date & time: 12/11/20  1000      History   Chief Complaint Chief Complaint  Patient presents with   Sore Throat   Nasal Congestion    APPT 10   Cough    HPI Monique Wallace is a 45 y.o. female.   HPI  Monique Wallace is a very pleasant 45 year old woman.  She is here with nasal congestion and cough.  Her son was seen for a similar infection was COVID-negative, diagnosed with a virus.  She is here because she is concerned about COVID, she has a daughter with Friedreich's ataxia.  She also wants to know what she can take for her illness because of a recently diagnosed alpha gal allergy. She is having some stomach upset and diarrhea.  She has not been counseled about what foods she can eat, just told to stay away from "anything with herbs". She looked up some of her medications and found that they have gelatin in them.  She feels like she might be allergic to any of them.  She stopped all of her medicines.  This is worrisome because she is a diabetic and her last A1c on the chart is over 10.  Blood pressure is mildly elevated.  I explained to her that she needs to call her primary care doctor today to determine what she can safely take to get back on the treatment she needs.  Past Medical History:  Diagnosis Date   Abnormal Pap smear 1990s   RPT DONE WAS NORMAL;LAST PAP 02/2010   Abnormal Pap smear 05/13/2001   HPV ON PAP   Allergy to alpha-gal    Anxiety 2005;2010   TAKEN KLONIPIN IN PAST   Complication of anesthesia 02/11/2004   INCREASED N/V AFTER C/S   Depression 05/14/2003   HAS TAKEN CYMBALTA IN PAST;STARTED AS PP DEPRESSION   Depression    Diabetes mellitus    TYPE II;CURRENTLY TAKING METFORMIN 100MG  DAILY   Gallstones 05/14/2003   Gestational diabetes    Hypertension    CURRENTLY TAKING LABETOLOL 100MG  BID   Infection    yeast inf;not freq   Infection    UTI X 1   Obesity    PONV (postoperative nausea and  vomiting)     Patient Active Problem List   Diagnosis Date Noted   Dehiscence of cesarean section wound, postpartum 05/16/2012   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   Status post repeat low transverse cesarean section 05/08/2012   Allergy to penicillin 11/28/2011   Allergy to phenergan 11/28/2011   BMI 50.0-59.9, adult (HCC) 11/28/2011   Hypertension 10/10/2011   Diabetes mellitus (HCC) 10/10/2011    Past Surgical History:  Procedure Laterality Date   CESAREAN SECTION  2005   GDM   CESAREAN SECTION  05/08/2012   Procedure: CESAREAN SECTION;  Surgeon: 2006, MD;  Location: [facility];  Service: Obstetrics;  Laterality: N/A;  Repeat Cesarean Section Delivery Baby Boy @ [phone],    CHOLECYSTECTOMY  2006   NO PAST SURGERIES     SHOULDER SURGERY  2011;2012   LT SHOULDER X 2   WISDOM TOOTH EXTRACTION     ALL 4 EXTRACTED    OB History     Gravida  3   Para  2   Term  2   Preterm  0   AB  1   Living  2      SAB  1   IAB  0   Ectopic  0   Multiple      Live Births  2            Home Medications    Prior to Admission medications   Medication Sig Start Date End Date Taking? Authorizing Provider  amLODipine (NORVASC) 5 MG tablet Take 5 mg by mouth daily. Patient not taking: Reported on 12/11/2020    [provider]  clonazePAM (KLONOPIN) 1 MG tablet Take 1 mg by mouth 3 (three) times daily as needed. Patient not taking: Reported on 12/11/2020 05/25/19   [provider]  escitalopram (LEXAPRO) 20 MG tablet Take 20 mg by mouth daily. Patient not taking: Reported on 12/11/2020 05/25/19   [provider]  ezetimibe (ZETIA) 10 MG tablet Take 1 tablet by mouth daily. Patient not taking: Reported on 12/11/2020 05/11/19   [provider]  glipiZIDE (GLUCOTROL) 10 MG tablet Take 1 tablet by mouth twice daily before meals. Patient not taking: Reported on 12/11/2020 05/11/19   [provider]  hydrochlorothiazide (HYDRODIURIL) 12.5  MG tablet 25 mg.  Patient not taking: Reported on 12/11/2020 05/11/19   [provider]  ibuprofen (ADVIL,MOTRIN) 600 MG tablet Take 1 tablet (600 mg total) by mouth every 6 (six) hours as needed for pain. Patient not taking: Reported on 12/11/2020 06/30/12   Nigel Bridgeman, CNM  metFORMIN (GLUCOPHAGE) 1000 MG tablet  09/30/11   [provider]  metoprolol succinate (TOPROL-XL) 100 MG 24 hr tablet Take 200 mg by mouth daily.  Patient not taking: Reported on 12/11/2020 06/05/19   [provider]  norethindrone (ORTHO MICRONOR) 0.35 MG tablet Take 1 tablet (0.35 mg total) by mouth daily. Start in 4 weeks take the same time each pm, if 3 hours out of that range could become pregnant if intercouse the next 48 hours Patient not taking: Reported on 12/11/2020 05/12/12   Lavera Guise, CNM  norethindrone (ORTHO MICRONOR) 0.35 MG tablet Take 1 tablet (0.35 mg total) by mouth daily. Start in 4 weeks take the same time each pm, if 3 hours out of that range could become pregnant if intercouse the next 48 hours Patient not taking: Reported on 12/11/2020 05/12/12   Lavera Guise, CNM  nystatin-triamcinolone ointment [facility]) Apply topically 3 (three) times daily as needed. Patient not taking: Reported on 12/11/2020 05/28/12   Silverio Lay, MD  ondansetron (ZOFRAN ODT) 4 MG disintegrating tablet Take 1 tablet (4 mg total) by mouth every 8 (eight) hours as needed for nausea or vomiting. Patient not taking: Reported on 12/11/2020 05/08/20   Frederica Kuster, MD  PRENATAL VIT-FEPOLY-FA-DHA PO Take 1 capsule by mouth daily. Patient not taking: Reported on 12/11/2020    [provider]  sertraline (ZOLOFT) 25 MG tablet Take 2 tablets (50 mg total) by mouth daily. Patient not taking: Reported on 12/11/2020 06/30/12   Nigel Bridgeman, CNM  simvastatin (ZOCOR) 20 MG tablet TAKE 1 TABLET BY MOUTH AT BEDTIME Patient not taking: Reported on 12/11/2020 01/06/19   [provider]  telmisartan  (MICARDIS) 40 MG tablet Take by mouth. Patient not taking: Reported on 12/11/2020 04/12/19 04/11/20  [provider]    Family History Family History  Problem Relation Age of Onset   Hypertension Mother    Physical abuse Mother        SEXUAL   Hyperlipidemia Maternal Grandmother    Cancer Maternal Grandmother        BONE   Physical abuse Maternal Grandmother  DOMESTIC   Hyperlipidemia Maternal Grandfather    Thyroid disease Brother        HYPERTHYROID    Social History Social History   Tobacco Use   Smoking status: Former    Types: Cigarettes    Quit date: 08/31/2002    Years since quitting: 18.2   Smokeless tobacco: Never  Vaping Use   Vaping Use: Never used  Substance Use Topics   Alcohol use: No    Comment: CURRENTLY ONLY DRANK 1-2 TIMES/YEAR;IN 20'S DRANK HEAVILY X 1 YEAR   Drug use: No     Allergies   Lactose intolerance (gi), Penicillins, Phenergan [promethazine hcl], and Tramadol   Review of Systems Review of Systems See HPI  Physical Exam Triage Vital Signs ED Triage Vitals  Enc Vitals Group     BP 12/11/20 1012 (!) 149/88     Pulse Rate 12/11/20 1012 87     Resp 12/11/20 1012 14     Temp 12/11/20 1012 99.1 F (37.3 C)     Temp Source 12/11/20 1012 Oral     SpO2 12/11/20 1012 96 %     Weight 12/11/20 1013 227 lb (103 kg)     Height 12/11/20 1013 4\' 10"  (1.473 m)     Head Circumference --      Peak Flow --      Pain Score 12/11/20 1013 4     Pain Loc --      Pain Edu? --      Excl. in GC? --    No data found.  Updated Vital Signs BP (!) 149/88 (BP Location: Left Arm)   Pulse 87   Temp 99.1 F (37.3 C) (Oral)   Resp 14   Ht 4\' 10"  (1.473 m)   Wt 103 kg   SpO2 96%   BMI 47.44 kg/m     Physical Exam Constitutional:      General: She is not in acute distress.    Appearance: She is well-developed. She is obese.  HENT:     Head: Normocephalic and atraumatic.     Right Ear: Tympanic membrane and ear canal normal.      Left Ear: Tympanic membrane and ear canal normal.     Nose: Congestion and rhinorrhea present.     Mouth/Throat:     Mouth: Mucous membranes are moist.     Pharynx: No posterior oropharyngeal erythema.  Eyes:     Conjunctiva/sclera: Conjunctivae normal.     Pupils: Pupils are equal, round, and reactive to light.  Cardiovascular:     Rate and Rhythm: Normal rate and regular rhythm.     Heart sounds: Normal heart sounds.  Pulmonary:     Effort: Pulmonary effort is normal. No respiratory distress.     Breath sounds: Normal breath sounds. No wheezing or rales.  Abdominal:     General: There is no distension.     Palpations: Abdomen is soft.  Musculoskeletal:        General: Normal range of motion.     Cervical back: Normal range of motion.  Skin:    General: Skin is warm and dry.  Neurological:     General: No focal deficit present.     Mental Status: She is alert.     UC Treatments / Results  Labs (all labs ordered are listed, but only abnormal results are displayed) Labs Reviewed  NOVEL CORONAVIRUS, NAA    EKG   Radiology No results found.  Procedures Procedures (including critical  care time)  Medications Ordered in UC Medications - No data to display  Initial Impression / Assessment and Plan / UC Course  I have reviewed the triage vital signs and the nursing notes.  Pertinent labs & imaging results that were available during my care of the patient were reviewed by me and considered in my medical decision making (see chart for details).     Explained that if son has a virus, she likely does 2.  I do think we should test for COVID out of caution.  I would not recommend any medications for her symptoms, they appear minor.  Call primary care today Final Clinical Impressions(s) / UC Diagnoses   Final diagnoses:  URI with cough and congestion     Discharge Instructions      Check My Chart for your covid result You will be called if the test is  positive Continue to rest.  Push fluids.  You can use a saline nasal spray to help with your congestion See your allergist on Thursday   ED Prescriptions   None    PDMP not reviewed this encounter.   Eustace MooreNelson, Orlen Leedy Sue, MD 12/11/20 [phone]

## 2020-12-11 NOTE — ED Triage Notes (Signed)
Pt seen in UC w/ c/o sore throat, nasal congestion, nausea, diarrhea, and headaches that began last week. Pt states her son has also been sick. Pt recenly diagnosed with Allergy to Alpha Gal and has stopped tanking all medications. Pt states she is unsure if GI upset is related to allergy.

## 2020-12-11 NOTE — Discharge Instructions (Addendum)
Check My Chart for your covid result You will be called if the test is positive Continue to rest.  Push fluids.  You can use a saline nasal spray to help with your congestion See your allergist on Thursday

## 2020-12-12 LAB — NOVEL CORONAVIRUS, NAA: SARS-CoV-2, NAA: NOT DETECTED

## 2020-12-12 LAB — SARS-COV-2, NAA 2 DAY TAT

## 2021-01-11 ENCOUNTER — Emergency Department (INDEPENDENT_AMBULATORY_CARE_PROVIDER_SITE_OTHER): Payer: PRIVATE HEALTH INSURANCE

## 2021-01-11 ENCOUNTER — Inpatient Hospital Stay
Admission: RE | Admit: 2021-01-11 | Discharge: 2021-01-11 | Disposition: A | Discharge status: Home / Self Care | Payer: Self-pay | Source: Ambulatory Visit

## 2021-01-11 ENCOUNTER — Other Ambulatory Visit: Payer: Self-pay

## 2021-01-11 ENCOUNTER — Encounter: Payer: Self-pay | Admitting: Emergency Medicine

## 2021-01-11 ENCOUNTER — Emergency Department (INDEPENDENT_AMBULATORY_CARE_PROVIDER_SITE_OTHER)
Admission: EM | Admit: 2021-01-11 | Discharge: 2021-01-11 | Disposition: A | Discharge status: Home / Self Care | Payer: PRIVATE HEALTH INSURANCE | Source: Home / Self Care

## 2021-01-11 DIAGNOSIS — M79675 Pain in left toe(s): Secondary | ICD-10-CM

## 2021-01-11 DIAGNOSIS — S9032XA Contusion of left foot, initial encounter: Secondary | ICD-10-CM

## 2021-01-11 MED ORDER — IBUPROFEN 800 MG PO TABS: 800.0000 mg | ORAL_TABLET | Freq: Three times a day (TID) | ORAL | 0 refills | Status: AC

## 2021-01-11 NOTE — ED Triage Notes (Signed)
Left Second toe injury, fell yesterday.

## 2021-01-11 NOTE — Discharge Instructions (Addendum)
Advised/encouraged patient to RICE left foot/affected toe for 25 minutes 2-3 times daily for the next 3 days.  Advised may use ibuprofen 1-2 times daily, as needed for the next 7-10 days.  Advised/encouraged patient to avoid offending, repetitive movement activities involving affected toe for the next 7-10 days.

## 2021-01-11 NOTE — ED Provider Notes (Signed)
[name]    CSN: [id number] Arrival date & time: 01/11/21  0955      History   Chief Complaint Chief Complaint  Patient presents with   Toe Injury    HPI Monique Wallace is a 45 y.o. female.   HPI 45 year old female presents with left foot second toe injury sustained during fall yesterday.  Past Medical History:  Diagnosis Date   Abnormal Pap smear 1990s   RPT DONE WAS NORMAL;LAST PAP 02/2010   Abnormal Pap smear 05/13/2001   HPV ON PAP   Allergy to alpha-gal    Anxiety 2005;2010   TAKEN KLONIPIN IN PAST   Complication of anesthesia 02/11/2004   INCREASED N/V AFTER C/S   Depression 05/14/2003   HAS TAKEN CYMBALTA IN PAST;STARTED AS PP DEPRESSION   Depression    Diabetes mellitus    TYPE II;CURRENTLY TAKING METFORMIN 100MG  DAILY   Gallstones 05/14/2003   Gestational diabetes    Hypertension    CURRENTLY TAKING LABETOLOL 100MG  BID   Infection    yeast inf;not freq   Infection    UTI X 1   Obesity    PONV (postoperative nausea and vomiting)     Patient Active Problem List   Diagnosis Date Noted   Dehiscence of cesarean section wound, postpartum 05/16/2012   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   Status post repeat low transverse cesarean section 05/08/2012   Allergy to penicillin 11/28/2011   Allergy to phenergan 11/28/2011   BMI 50.0-59.9, adult (HCC) 11/28/2011   Hypertension 10/10/2011   Diabetes mellitus (HCC) 10/10/2011    Past Surgical History:  Procedure Laterality Date   CESAREAN SECTION  2005   GDM   CESAREAN SECTION  05/08/2012   Procedure: CESAREAN SECTION;  Surgeon: 2006, MD;  Location: [facility];  Service: Obstetrics;  Laterality: N/A;  Repeat Cesarean Section Delivery Baby Boy @ [phone],    CHOLECYSTECTOMY  2006   NO PAST SURGERIES     SHOULDER SURGERY  2011;2012   LT SHOULDER X 2   WISDOM TOOTH EXTRACTION     ALL 4 EXTRACTED    OB History     Gravida  3   Para  2   Term  2   Preterm  0   AB  1    Living  2      SAB  1   IAB  0   Ectopic  0   Multiple      Live Births  2            Home Medications    Prior to Admission medications   Medication Sig Start Date End Date Taking? Authorizing Provider  ibuprofen (ADVIL) 800 MG tablet Take 1 tablet (800 mg total) by mouth 3 (three) times daily. 01/11/21  Yes 09-21-1985, FNP  amLODipine (NORVASC) 5 MG tablet Take 5 mg by mouth daily. Patient not taking: Reported on 12/11/2020    [provider]  clonazePAM (KLONOPIN) 1 MG tablet Take 1 mg by mouth 3 (three) times daily as needed. Patient not taking: Reported on 12/11/2020 05/25/19   [provider]  escitalopram (LEXAPRO) 20 MG tablet Take 20 mg by mouth daily. Patient not taking: Reported on 12/11/2020 05/25/19   [provider]  ezetimibe (ZETIA) 10 MG tablet Take 1 tablet by mouth daily. Patient not taking: Reported on 12/11/2020 05/11/19   [provider]  glipiZIDE (GLUCOTROL) 10 MG tablet Take 1 tablet by mouth twice daily before  meals. Patient not taking: Reported on 12/11/2020 05/11/19   [provider]  hydrochlorothiazide (HYDRODIURIL) 12.5 MG tablet 25 mg.  Patient not taking: Reported on 12/11/2020 05/11/19   [provider]  metFORMIN (GLUCOPHAGE) 1000 MG tablet  09/30/11   [provider]  metoprolol succinate (TOPROL-XL) 100 MG 24 hr tablet Take 200 mg by mouth daily.  Patient not taking: Reported on 12/11/2020 06/05/19   [provider]  norethindrone (ORTHO MICRONOR) 0.35 MG tablet Take 1 tablet (0.35 mg total) by mouth daily. Start in 4 weeks take the same time each pm, if 3 hours out of that range could become pregnant if intercouse the next 48 hours Patient not taking: Reported on 12/11/2020 05/12/12   Lavera Guise, CNM  norethindrone (ORTHO MICRONOR) 0.35 MG tablet Take 1 tablet (0.35 mg total) by mouth daily. Start in 4 weeks take the same time each pm, if 3 hours out of that range could become  pregnant if intercouse the next 48 hours Patient not taking: Reported on 12/11/2020 05/12/12   Lavera Guise, CNM  nystatin-triamcinolone ointment [facility] Apply topically 3 (three) times daily as needed. Patient not taking: Reported on 12/11/2020 05/28/12   Silverio Lay, MD  ondansetron (ZOFRAN ODT) 4 MG disintegrating tablet Take 1 tablet (4 mg total) by mouth every 8 (eight) hours as needed for nausea or vomiting. Patient not taking: Reported on 12/11/2020 05/08/20   Frederica Kuster, MD  PRENATAL VIT-FEPOLY-FA-DHA PO Take 1 capsule by mouth daily. Patient not taking: Reported on 12/11/2020    [provider]  sertraline (ZOLOFT) 25 MG tablet Take 2 tablets (50 mg total) by mouth daily. Patient not taking: Reported on 12/11/2020 06/30/12   Nigel Bridgeman, CNM  simvastatin (ZOCOR) 20 MG tablet TAKE 1 TABLET BY MOUTH AT BEDTIME Patient not taking: Reported on 12/11/2020 01/06/19   [provider]  telmisartan (MICARDIS) 40 MG tablet Take by mouth. Patient not taking: Reported on 12/11/2020 04/12/19 04/11/20  [provider]    Family History Family History  Problem Relation Age of Onset   Hypertension Mother    Physical abuse Mother        SEXUAL   Hyperlipidemia Maternal Grandmother    Cancer Maternal Grandmother        BONE   Physical abuse Maternal Grandmother        DOMESTIC   Hyperlipidemia Maternal Grandfather    Thyroid disease Brother        HYPERTHYROID    Social History Social History   Tobacco Use   Smoking status: Former    Types: Cigarettes    Quit date: 08/31/2002    Years since quitting: 18.3   Smokeless tobacco: Never  Vaping Use   Vaping Use: Never used  Substance Use Topics   Alcohol use: No    Comment: CURRENTLY ONLY DRANK 1-2 TIMES/YEAR;IN 20'S DRANK HEAVILY X 1 YEAR   Drug use: No     Allergies   Alpha-d-galactosidase, Lactose intolerance (gi), Penicillins, Phenergan [promethazine hcl], and Tramadol   Review of Systems Review  of Systems  Musculoskeletal:        Left foot second toe pain x 1 day    Physical Exam Triage Vital Signs ED Triage Vitals  Enc Vitals Group     BP      Pulse      Resp      Temp      Temp src      SpO2  Weight      Height      Head Circumference      Peak Flow      Pain Score      Pain Loc      Pain Edu?      Excl. in GC?    No data found.  Updated Vital Signs BP (!) 158/87 (BP Location: Right Arm)   Pulse 80   Temp 98.4 F (36.9 C) (Oral)   Resp 18   Ht 4\' 10"  (1.473 m)   Wt 221 lb (100.2 kg)   LMP 01/10/2021   SpO2 99%   BMI 46.19 kg/m   Physical Exam Vitals and nursing note reviewed.  Constitutional:      General: She is not in acute distress.    Appearance: Normal appearance. She is obese. She is not ill-appearing.  HENT:     Head: Normocephalic and atraumatic.     Mouth/Throat:     Mouth: Mucous membranes are moist.     Pharynx: Oropharynx is clear.  Eyes:     Extraocular Movements: Extraocular movements intact.     Conjunctiva/sclera: Conjunctivae normal.     Pupils: Pupils are equal, round, and reactive to light.  Cardiovascular:     Rate and Rhythm: Normal rate and regular rhythm.     Pulses: Normal pulses.     Heart sounds: Normal heart sounds.  Pulmonary:     Effort: Pulmonary effort is normal.     Breath sounds: Rhonchi present. No wheezing or rales.  Musculoskeletal:        General: Tenderness present. No swelling, deformity or signs of injury.     Cervical back: Normal range of motion and neck supple.     Comments: Left foot (second toe/distal phalanx): TTP with mild soft tissue swelling noted  Skin:    General: Skin is warm and dry.  Neurological:     General: No focal deficit present.     Mental Status: She is alert and oriented to person, place, and time. Mental status is at baseline.  Psychiatric:        Mood and Affect: Mood normal.        Behavior: Behavior normal.        Thought Content: Thought content normal.      UC Treatments / Results  Labs (all labs ordered are listed, but only abnormal results are displayed) Labs Reviewed - No data to display  EKG   Radiology DG Toe 2nd Left  Result Date: 01/11/2021 CLINICAL DATA:  Fall yesterday, left second toe pain EXAM: LEFT SECOND TOE COMPARISON:  Left foot radiograph 02/04/2007 FINDINGS: There is no evidence of acute fracture involving the second toe. Normal alignment. There is mild interphalangeal joint degenerative change. IMPRESSION: No acute osseous abnormality. Electronically Signed   By: 02/06/2007 M.D.   On: 01/11/2021 11:02    Procedures Procedures (including critical care time)  Medications Ordered in UC Medications - No data to display  Initial Impression / Assessment and Plan / UC Course  I have reviewed the triage vital signs and the nursing notes.  Pertinent labs & imaging results that were available during my care of the patient were reviewed by me and considered in my medical decision making (see chart for details).     MDM: 1.  Pain of toe of left foot-left foot x-ray is negative for acute fracture/subluxation; 2.  Left Foot contusion-Rx'd Ibuprofen. Advised/encouraged patient to RICE left foot/affected toe for 25 minutes 2-3  times daily for the next 3 days.  Advised may use ibuprofen 1-2 times daily, as needed for the next 7-10 days.  Advised/encouraged patient to avoid offending, repetitive movement activities involving affected toe for the next 7-10 days.  Patient discharged home, hemodynamically stable. Final Clinical Impressions(s) / UC Diagnoses   Final diagnoses:  Pain of toe of left foot  Contusion of left foot, initial encounter     Discharge Instructions      Advised/encouraged patient to RICE left foot/affected toe for 25 minutes 2-3 times daily for the next 3 days.  Advised may use ibuprofen 1-2 times daily, as needed for the next 7-10 days.  Advised/encouraged patient to avoid offending, repetitive movement  activities involving affected toe for the next 7-10 days.     ED Prescriptions     Medication Sig Dispense Auth. Provider   ibuprofen (ADVIL) 800 MG tablet Take 1 tablet (800 mg total) by mouth 3 (three) times daily. 30 tablet Trevor Ihaagan, Toiya Morrish, FNP      PDMP not reviewed this encounter.   Trevor IhaRagan, Ameliya Nicotra, FNP 01/11/21 1123
